# Patient Record
Sex: Female | Born: 1935 | Race: White | Hispanic: No | Marital: Married | Smoking: Never smoker
Health system: Southern US, Community
[De-identification: ages and names within clinical notes are randomized; demographics above are authoritative.]

## PROBLEM LIST (undated history)

## (undated) DIAGNOSIS — N289 Disorder of kidney and ureter, unspecified: Secondary | ICD-10-CM

## (undated) DIAGNOSIS — R7301 Impaired fasting glucose: Secondary | ICD-10-CM

## (undated) DIAGNOSIS — J810 Acute pulmonary edema: Secondary | ICD-10-CM

## (undated) DIAGNOSIS — I639 Cerebral infarction, unspecified: Secondary | ICD-10-CM

## (undated) DIAGNOSIS — H3530 Unspecified macular degeneration: Secondary | ICD-10-CM

## (undated) DIAGNOSIS — M1990 Unspecified osteoarthritis, unspecified site: Secondary | ICD-10-CM

## (undated) DIAGNOSIS — E785 Hyperlipidemia, unspecified: Secondary | ICD-10-CM

## (undated) DIAGNOSIS — I10 Essential (primary) hypertension: Secondary | ICD-10-CM

## (undated) HISTORY — PX: TONSILLECTOMY: SUR1361

## (undated) HISTORY — PX: TUBAL LIGATION: SHX77

## (undated) HISTORY — PX: CATARACT EXTRACTION: SUR2

---

## 1998-08-20 ENCOUNTER — Ambulatory Visit (HOSPITAL_COMMUNITY): Admission: RE | Admit: 1998-08-20 | Discharge: 1998-08-20 | Payer: Self-pay | Admitting: Urology

## 2008-01-16 ENCOUNTER — Ambulatory Visit: Payer: Self-pay | Admitting: Cardiology

## 2008-01-26 ENCOUNTER — Ambulatory Visit: Payer: Self-pay | Admitting: Cardiology

## 2009-08-26 DIAGNOSIS — R079 Chest pain, unspecified: Secondary | ICD-10-CM

## 2009-08-26 DIAGNOSIS — E785 Hyperlipidemia, unspecified: Secondary | ICD-10-CM

## 2009-11-22 ENCOUNTER — Ambulatory Visit: Payer: Self-pay | Admitting: Cardiology

## 2010-10-14 ENCOUNTER — Ambulatory Visit: Payer: Self-pay | Admitting: Internal Medicine

## 2010-10-30 ENCOUNTER — Ambulatory Visit: Payer: Self-pay | Admitting: Internal Medicine

## 2010-10-30 ENCOUNTER — Ambulatory Visit (HOSPITAL_COMMUNITY): Admission: RE | Admit: 2010-10-30 | Discharge: 2010-10-30 | Payer: Self-pay | Admitting: Internal Medicine

## 2011-04-28 NOTE — Assessment & Plan Note (Signed)
[facility]                          [place] [facility] OFFICE NOTE   TIFFANCY, MOGER                      MRN:          [id number]  DATE:01/16/2008                            DOB:          [birth year]    PRIMARY CARDIOLOGIST:  Luis Abed, MD, [facility] (new)   REFERRING PHYSICIAN:  Dr. Fara Chute.   REASON FOR CONSULTATION:  Brooke Gibbs is a 77 year old female, with  history of atypical chest pain but no documented history of ischemic  heart disease, now referred for evaluation of chest pain.   During a recent routine clinic follow-up, Brooke Gibbs reported  experiencing some recent left-sided chest discomfort.  However, this  occurred in early December and she is extremely vague on the surrounding  circumstances for this episode.  In fact, she is unable to recall what  precipitated the discomfort, but feels that it could not have lasted  very long.  She is unable to recall what she was doing at that time.  She seems to suggest that it was essentially localized to the left  shoulder, and that it did not travel all the way down to the fingertips.  It is also not clear if this extended into the left side of the  precordium.  What is clear, however, is that  she denies any exertion-  induced chest discomfort either remotely, or in the recent past.  In  fact, the patient states that she typically walks several times a day  and has never experienced any associated chest discomfort, even when  walking uphill.   The patient's cardiac risk factors notable for hyperlipidemia, tobacco  smoking, age, and family history.   Ms. Sherman has not had prior workup with either a 2-D echocardiogram or  a stress test.  Carotid Doppler study in November, 2007 did not suggest  any significant atherosclerosis.   Electrocardiogram today reveals NSR at 61 bpm with normal axis and  nonspecific ST abnormalities.  There is also poor R-wave progression in  the  precordial leads.   ALLERGIES:  STEROIDS.   CURRENT MEDICATIONS:  1. Aspirin 81 daily.  2. Glucosamine.  3. Fish oil 1000 daily.  4. Red Yeast Rice.  5. Pantoprazole 4o daily.  6. Lorazepam 1.5 mg daily.   PAST MEDICAL HISTORY:  Hyperlipidemia.   SURGICAL HISTORY:  Remote foot surgery.   SOCIAL HISTORY:  The patient smokes an occasional cigar.  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   REVIEW OF SYSTEMS:  Denies history of hypertension or diabetes mellitus.  Otherwise, as noted per HPI, remaining systems negative.   PHYSICAL EXAMINATION:  Blood pressure 134/84, pulse 66, regular; weight  178.8.  GENERAL:  77 year old female sitting upright in no distress.  HEENT: Normocephalic, atraumatic.  NECK: Palpable bilateral carotid pulses without bruits. No JVD at 90  degrees.  LUNGS:  Clear to auscultation in all fields.  HEART: Regular rate and rhythm (S1, S2). No significant murmurs. No  rubs.  ABDOMEN: protuberant, nontender with intact bowel sounds.  EXTREMITIES: Palpable distal pulses without significant edema.  NEURO: No focal deficit.  FAMILY HISTORY:  Sister deceased in her 33s, status post bypass surgery  at age 68.  Brother died in his 46s, complications of stroke.   LABORATORY DATA:  Lipid profile in 2003 notable for total cholesterol  221, triglyceride 115, HDL 56 and LDL of 142.   IMPRESSION:  1. Atypical chest pain.  2. Multiple cardiac risk factors.      a.     Hyperlipidemia.      b.     Tobacco.      c.     Family history.      d.     Age.   PLAN:  1. Schedule adenosine stress Cardiolite for risk stratification.  2. Schedule a 2-D echocardiogram to assess left ventricular function      and rule out underlying structural abnormalities.  3. Schedule return clinic follow-up with myself and Dr. Myrtis Ser in 1      month, for review of study      results and further recommendations.  In the interim, the patient      is to continue current medication  regimen.      Rozell Searing, PA-C  Electronically Signed      Luis Abed, MD, [facility]  Electronically Signed   GS/MedQ  DD: 01/16/2008  DT: 01/16/2008  Job #: [id number]   cc:   Selinda Flavin

## 2011-05-01 NOTE — H&P (Signed)
NAME:  Brooke Gibbs, Brooke Gibbs               ACCOUNT NO.:  [id number]   MEDICAL RECORD NO.:  [id number]        PATIENT TYPE:  PAMB   LOCATION:  DAY                           FACILITY:  [facility]   PHYSICIAN:  Lionel December, M.D.         DATE OF BIRTH:   DATE OF ADMISSION:  DATE OF DISCHARGE:  LH                              HISTORY & PHYSICAL    HISTORY OF PRESENT ILLNESS:  Brooke Gibbs is a referral from Dr. Leandrew Koyanagi  for change in bowel habits.  She complains of fullness in Brooke Gibbs abdomen.  She states she takes a laxative about 3 times a week to have a bowel  movement.  She has also taken a stool softener at night for Brooke Gibbs  constipation which has been occurring for about 3 months.  She states  she used to exercise, but stopped after Brooke Gibbs daughter died 4 years ago  from kidney disease.  Brooke Gibbs appetite has remained good.  She has had no  unintentional weight loss.  Brooke Gibbs BMs are usually dark brown, hard, normal  size, occasionally they are small.  She usually has a bowel movement  about every other day.  She does not complain of abdominal pain today  just an uncomfortable feeling.  She also complains of abdominal  distention which has been occurring off and on for about 3 months.   REVIEW OF SYSTEMS:  She denies fever, fatigue.  No weight loss, no  appetite changes, no dysphagia, no nausea, vomiting, no hematemesis, no  melena or bright red rectal bleeding.  Of note, she has never had a  colonoscopy.   ALLERGIES:  She is allergic to SULFA which causes itching and she is  allergic to STEROIDS which causes hyperactivity.   MEDICATIONS:  1. She is on MVI one a day.  2. Fish oil one a day.  3. Glucosamine one a day.  4. Calcium one a day.  5. Gluten one a day.  6. Simvastatin 40 mg one a day.  7. Omeprazole 20 mg one a day.  8. Ativan 1 mg at bedtime.  9. Stool softener daily.  10.Aspirin 81 mg a day.   She has never had any surgeries.   Medical history includes high cholesterol, macular  degeneration, acid  reflux, interstitial cystitis, arthritis.   FAMILY HISTORY:  Brooke Gibbs mother is deceased from pneumonia.  Brooke Gibbs father  committed suicide.  She has 2 sisters, one is deceased from unknown  causes and lived to be in Brooke Gibbs 86s, one sister is alive with a history of  leukemia and she has breathing problems.  One brother deceased from a  CVA.  She is married.  She is retired from [employer].  She does  not smoke, drink, or do drugs.  She had 9 children, one is deceased from  a kidney disease, 2 children have had kidney transplants, and 6 are in  good health at this time.   OBJECTIVE:  VITAL SIGNS:  Brooke Gibbs weight is 189.3, height 5 feet 3 inches,  blood pressure is 132/80, Brooke Gibbs pulse is 80.  MOUTH:  She wears  dentures upper and lower.  Brooke Gibbs oral mucosa is moist.  There are no lesions.  EYES:  Brooke Gibbs conjunctivae are pink.  Brooke Gibbs sclerae are anicteric.  NECK:  Brooke Gibbs thyroid is normal.  There is no cervical lymphadenopathy.  LUNGS:  Clear.  HEART:  Slightly irregular.  No murmurs heard.  EXTREMITIES:  There is no edema to Brooke Gibbs extremities.  ABDOMEN:  Soft.  Bowel sounds are positive.  No masses.  She does have  prominent veins to Brooke Gibbs left side of Brooke Gibbs abdomen.  NEUROLOGIC:  She is alert and oriented.   LABORATORY DATA:  Hemoglobin is 13.5, hematocrit is 40.0.  WBC is 4.9,  RBC is 4.47, MCV is 90, MCH 30.2, Brooke Gibbs platelets are 273, glucose is 101,  BUN is 11, creatinine is 0.86.  Brooke Gibbs TSH is 3.94.   ASSESSMENT:  Ms. Gibbs is a 77 year old female with a new onset of  constipation for 3 months and a feeling of bloating and abdominal  distention.  Colonic neoplasm needs to be ruled out at this point.  She  has never had a colonoscopy.   RECOMMENDATIONS:  We will schedule a diagnostic colonoscopy with Dr.  Karilyn Cota.  Colonoscopy risk and benefits were reviewed with the patient.  She is to start MiraLax one half scoop to one scoop daily for Brooke Gibbs  constipation.  She is to continue the  omeprazole 20 mg once a day.     ______________________________  Dorene Ar, NP    ______________________________  Lionel December, M.D.     TS/MEDQ  D:  10/14/2010  T:  10/14/2010  Job:  [id number]

## 2012-08-05 DIAGNOSIS — R072 Precordial pain: Secondary | ICD-10-CM

## 2012-08-22 ENCOUNTER — Inpatient Hospital Stay (HOSPITAL_COMMUNITY)
Admission: EM | Admit: 2012-08-22 | Discharge: 2012-08-26 | DRG: 482 | Disposition: A | Discharge status: Skilled Nursing Facility | Payer: Medicare HMO | Attending: Internal Medicine | Admitting: Internal Medicine

## 2012-08-22 ENCOUNTER — Encounter (HOSPITAL_COMMUNITY): Payer: Self-pay | Admitting: *Deleted

## 2012-08-22 ENCOUNTER — Emergency Department (HOSPITAL_COMMUNITY): Payer: Medicare HMO

## 2012-08-22 DIAGNOSIS — K5900 Constipation, unspecified: Secondary | ICD-10-CM | POA: Diagnosis present

## 2012-08-22 DIAGNOSIS — Z79899 Other long term (current) drug therapy: Secondary | ICD-10-CM

## 2012-08-22 DIAGNOSIS — W19XXXA Unspecified fall, initial encounter: Secondary | ICD-10-CM | POA: Diagnosis present

## 2012-08-22 DIAGNOSIS — R030 Elevated blood-pressure reading, without diagnosis of hypertension: Secondary | ICD-10-CM | POA: Diagnosis present

## 2012-08-22 DIAGNOSIS — S72001A Fracture of unspecified part of neck of right femur, initial encounter for closed fracture: Secondary | ICD-10-CM | POA: Diagnosis present

## 2012-08-22 DIAGNOSIS — R7309 Other abnormal glucose: Secondary | ICD-10-CM | POA: Diagnosis present

## 2012-08-22 DIAGNOSIS — H3530 Unspecified macular degeneration: Secondary | ICD-10-CM | POA: Diagnosis present

## 2012-08-22 DIAGNOSIS — Z6836 Body mass index (BMI) 36.0-36.9, adult: Secondary | ICD-10-CM

## 2012-08-22 DIAGNOSIS — E669 Obesity, unspecified: Secondary | ICD-10-CM | POA: Diagnosis present

## 2012-08-22 DIAGNOSIS — D72829 Elevated white blood cell count, unspecified: Secondary | ICD-10-CM | POA: Diagnosis present

## 2012-08-22 DIAGNOSIS — Y92009 Unspecified place in unspecified non-institutional (private) residence as the place of occurrence of the external cause: Secondary | ICD-10-CM

## 2012-08-22 DIAGNOSIS — Z7982 Long term (current) use of aspirin: Secondary | ICD-10-CM

## 2012-08-22 DIAGNOSIS — Z882 Allergy status to sulfonamides status: Secondary | ICD-10-CM

## 2012-08-22 DIAGNOSIS — S72009A Fracture of unspecified part of neck of unspecified femur, initial encounter for closed fracture: Principal | ICD-10-CM | POA: Diagnosis present

## 2012-08-22 DIAGNOSIS — E782 Mixed hyperlipidemia: Secondary | ICD-10-CM | POA: Diagnosis present

## 2012-08-22 DIAGNOSIS — M129 Arthropathy, unspecified: Secondary | ICD-10-CM | POA: Diagnosis present

## 2012-08-22 DIAGNOSIS — R739 Hyperglycemia, unspecified: Secondary | ICD-10-CM | POA: Diagnosis present

## 2012-08-22 DIAGNOSIS — E785 Hyperlipidemia, unspecified: Secondary | ICD-10-CM

## 2012-08-22 DIAGNOSIS — R079 Chest pain, unspecified: Secondary | ICD-10-CM

## 2012-08-22 HISTORY — DX: Hyperlipidemia, unspecified: E78.5

## 2012-08-22 HISTORY — DX: Unspecified macular degeneration: H35.30

## 2012-08-22 HISTORY — DX: Unspecified osteoarthritis, unspecified site: M19.90

## 2012-08-22 LAB — URINALYSIS, ROUTINE W REFLEX MICROSCOPIC
Bilirubin Urine: NEGATIVE
Glucose, UA: NEGATIVE mg/dL
Ketones, ur: NEGATIVE mg/dL
Protein, ur: NEGATIVE mg/dL
Urobilinogen, UA: 0.2 mg/dL (ref 0.0–1.0)

## 2012-08-22 LAB — CBC WITH DIFFERENTIAL/PLATELET
HCT: 40.2 % (ref 36.0–46.0)
Hemoglobin: 13.5 g/dL (ref 12.0–15.0)
Lymphocytes Relative: 8 % — ABNORMAL LOW (ref 12–46)
Lymphs Abs: 1.4 10*3/uL (ref 0.7–4.0)
Monocytes Absolute: 1.5 10*3/uL — ABNORMAL HIGH (ref 0.1–1.0)
Monocytes Relative: 9 % (ref 3–12)
Neutro Abs: 14 10*3/uL — ABNORMAL HIGH (ref 1.7–7.7)
WBC: 16.9 10*3/uL — ABNORMAL HIGH (ref 4.0–10.5)

## 2012-08-22 LAB — PREPARE RBC (CROSSMATCH)

## 2012-08-22 LAB — COMPREHENSIVE METABOLIC PANEL
AST: 17 U/L (ref 0–37)
BUN: 16 mg/dL (ref 6–23)
CO2: 29 mEq/L (ref 19–32)
Chloride: 100 mEq/L (ref 96–112)
Creatinine, Ser: 0.79 mg/dL (ref 0.50–1.10)
GFR calc non Af Amer: 77 mL/min — ABNORMAL LOW (ref >90)
Total Bilirubin: 0.7 mg/dL (ref 0.3–1.2)

## 2012-08-22 LAB — URINE MICROSCOPIC-ADD ON

## 2012-08-22 MED ORDER — ONDANSETRON HCL 4 MG/2ML IJ SOLN: 4.0000 mg | Freq: Four times a day (QID) | INTRAMUSCULAR | Status: DC | PRN

## 2012-08-22 MED ORDER — DIPHENHYDRAMINE HCL 50 MG/ML IJ SOLN: 12.5000 mg | Freq: Four times a day (QID) | INTRAMUSCULAR | Status: DC | PRN

## 2012-08-22 MED ORDER — DOCUSATE SODIUM 100 MG PO CAPS
100.0000 mg | ORAL_CAPSULE | Freq: Two times a day (BID) | ORAL | Status: DC
  Administered 2012-08-22 – 2012-08-26 (×8): 100 mg via ORAL
  Filled 2012-08-22 (×8): qty 1

## 2012-08-22 MED ORDER — VITAMIN B-12 100 MCG PO TABS
100.0000 ug | ORAL_TABLET | Freq: Every day | ORAL | Status: DC
  Administered 2012-08-22 – 2012-08-26 (×5): 100 ug via ORAL
  Filled 2012-08-22 (×8): qty 1

## 2012-08-22 MED ORDER — CEFAZOLIN SODIUM 1-5 GM-% IV SOLN: 1.0000 g | INTRAVENOUS | Status: DC

## 2012-08-22 MED ORDER — POLYETHYLENE GLYCOL 3350 17 G PO PACK
17.0000 g | PACK | Freq: Every day | ORAL | Status: DC | PRN
  Administered 2012-08-26: 17 g via ORAL
  Filled 2012-08-22: qty 1

## 2012-08-22 MED ORDER — ACETAMINOPHEN 325 MG PO TABS: 650.0000 mg | ORAL_TABLET | Freq: Four times a day (QID) | ORAL | Status: DC | PRN

## 2012-08-22 MED ORDER — DEXTROSE-NACL 5-0.45 % IV SOLN
INTRAVENOUS | Status: DC
  Administered 2012-08-22 – 2012-08-23 (×2): via INTRAVENOUS

## 2012-08-22 MED ORDER — NALOXONE HCL 0.4 MG/ML IJ SOLN: 0.4000 mg | INTRAMUSCULAR | Status: DC | PRN

## 2012-08-22 MED ORDER — OCUVITE-LUTEIN PO CAPS
1.0000 | ORAL_CAPSULE | Freq: Every day | ORAL | Status: DC
  Administered 2012-08-22 – 2012-08-26 (×5): 1 via ORAL
  Filled 2012-08-22 (×5): qty 1

## 2012-08-22 MED ORDER — ONDANSETRON HCL 4 MG PO TABS
4.0000 mg | ORAL_TABLET | Freq: Four times a day (QID) | ORAL | Status: DC | PRN
  Administered 2012-08-23: 4 mg via ORAL
  Filled 2012-08-22: qty 1

## 2012-08-22 MED ORDER — ALUM & MAG HYDROXIDE-SIMETH 200-200-20 MG/5ML PO SUSP: 30.0000 mL | Freq: Four times a day (QID) | ORAL | Status: DC | PRN

## 2012-08-22 MED ORDER — ADULT MULTIVITAMIN W/MINERALS CH
1.0000 | ORAL_TABLET | Freq: Every day | ORAL | Status: DC
  Administered 2012-08-22 – 2012-08-24 (×3): 1 via ORAL
  Filled 2012-08-22 (×4): qty 1

## 2012-08-22 MED ORDER — SIMVASTATIN 20 MG PO TABS
20.0000 mg | ORAL_TABLET | Freq: Every day | ORAL | Status: DC
  Administered 2012-08-22 – 2012-08-26 (×5): 20 mg via ORAL
  Filled 2012-08-22 (×5): qty 1

## 2012-08-22 MED ORDER — MORPHINE SULFATE (PF) 1 MG/ML IV SOLN
INTRAVENOUS | Status: DC
  Administered 2012-08-22: 17:00:00 via INTRAVENOUS
  Administered 2012-08-22: 12 mg via INTRAVENOUS
  Administered 2012-08-23: 3 mg via INTRAVENOUS
  Administered 2012-08-23: 1.8 mg via INTRAVENOUS
  Administered 2012-08-23: 6 mg via INTRAVENOUS
  Administered 2012-08-23: 09:00:00 via INTRAVENOUS
  Administered 2012-08-23: 1.5 mg via INTRAVENOUS
  Filled 2012-08-22 (×2): qty 25

## 2012-08-22 MED ORDER — SODIUM CHLORIDE 0.9 % IJ SOLN: 9.0000 mL | INTRAMUSCULAR | Status: DC | PRN

## 2012-08-22 MED ORDER — BIOTENE DRY MOUTH MT LIQD
15.0000 mL | Freq: Two times a day (BID) | OROMUCOSAL | Status: DC
  Administered 2012-08-22 – 2012-08-26 (×8): 15 mL via OROMUCOSAL

## 2012-08-22 MED ORDER — POVIDONE-IODINE 10 % EX SOLN
Freq: Once | CUTANEOUS | Status: AC
  Administered 2012-08-22: 22:00:00 via TOPICAL
  Filled 2012-08-22: qty 118

## 2012-08-22 MED ORDER — OMEGA-3-ACID ETHYL ESTERS 1 G PO CAPS
1.0000 g | ORAL_CAPSULE | Freq: Every day | ORAL | Status: DC
  Administered 2012-08-22 – 2012-08-26 (×5): 1 g via ORAL
  Filled 2012-08-22 (×5): qty 1

## 2012-08-22 MED ORDER — PANTOPRAZOLE SODIUM 40 MG PO TBEC
40.0000 mg | DELAYED_RELEASE_TABLET | Freq: Every day | ORAL | Status: DC
  Administered 2012-08-24 – 2012-08-26 (×3): 40 mg via ORAL
  Filled 2012-08-22 (×3): qty 1

## 2012-08-22 MED ORDER — CALCIUM CARBONATE-VITAMIN D 500-200 MG-UNIT PO TABS
1.0000 | ORAL_TABLET | Freq: Every day | ORAL | Status: DC
  Administered 2012-08-22 – 2012-08-26 (×5): 1 via ORAL
  Filled 2012-08-22 (×5): qty 1

## 2012-08-22 MED ORDER — ACETAMINOPHEN 650 MG RE SUPP: 650.0000 mg | Freq: Four times a day (QID) | RECTAL | Status: DC | PRN

## 2012-08-22 MED ORDER — LORAZEPAM 1 MG PO TABS
1.0000 mg | ORAL_TABLET | Freq: Every day | ORAL | Status: DC
  Administered 2012-08-22 – 2012-08-25 (×4): 1 mg via ORAL
  Filled 2012-08-22 (×4): qty 1

## 2012-08-22 MED ORDER — DIPHENHYDRAMINE HCL 12.5 MG/5ML PO ELIX: 12.5000 mg | ORAL_SOLUTION | Freq: Four times a day (QID) | ORAL | Status: DC | PRN

## 2012-08-22 NOTE — ED Provider Notes (Addendum)
History     CSN: [id number]  Arrival date & time 08/22/12  1157   First MD Initiated Contact with Patient 08/22/12 1234      Chief Complaint  Patient presents with  . Hip Pain    (Consider location/radiation/quality/duration/timing/severity/associated sxs/prior treatment) HPI...Marland Kitchenaccidental fall at home today resulting in right hip pain. No head or neck trauma. Movement makes symptoms worse. Severity is moderate to severe. No radiation of pain. No other injuries.  Past Medical History  Diagnosis Date  . Arthritis     History reviewed. No pertinent past surgical history.  History reviewed. No pertinent family history.  History  Substance Use Topics  . Smoking status: Never Smoker   . Smokeless tobacco: Not on file  . Alcohol Use: No    OB History    Grav Para Term Preterm Abortions TAB SAB Ect Mult Living                  Review of Systems  All other systems reviewed and are negative.    Allergies  Sulfa drugs cross reactors  Home Medications   Current Outpatient Rx  Name Route Sig Dispense Refill  . ASPIRIN EC 81 MG PO TBEC Oral Take 81 mg by mouth daily.    Marland Kitchen CALCIUM + D PO Oral Take 1 tablet by mouth daily.    Marland Kitchen VITAMIN B-12 PO Oral Take 1 tablet by mouth daily.    . OMEGA-3 FATTY ACIDS 1000 MG PO CAPS Oral Take 1 g by mouth daily.    Marland Kitchen LORAZEPAM 1 MG PO TABS Oral Take 1 mg by mouth at bedtime.    Carma Leaven M PLUS PO TABS Oral Take 1 tablet by mouth daily.    . OCUVITE-LUTEIN PO CAPS Oral Take 1 capsule by mouth daily.    Marland Kitchen OMEPRAZOLE 20 MG PO CPDR Oral Take 20 mg by mouth daily.    Marland Kitchen SIMVASTATIN 20 MG PO TABS Oral Take 20 mg by mouth every evening.      BP 156/73  Pulse 71  Temp 98 F (36.7 C) (Oral)  Resp 20  Ht 5\' 2"  (1.575 m)  Wt 184 lb (83.462 kg)  BMI 33.65 kg/m2  SpO2 98%  Physical Exam  Nursing note and vitals reviewed. Constitutional: She is oriented to person, place, and time. She appears well-developed and well-nourished.  HENT:    Head: Normocephalic and atraumatic.  Eyes: Conjunctivae and EOM are normal. Pupils are equal, round, and reactive to light.  Neck: Normal range of motion. Neck supple.  Cardiovascular: Normal rate, regular rhythm and normal heart sounds.   Pulmonary/Chest: Effort normal and breath sounds normal.  Abdominal: Soft. Bowel sounds are normal.  Musculoskeletal:       Tender right lateral hip  Neurological: She is alert and oriented to person, place, and time.  Skin: Skin is warm and dry.  Psychiatric: She has a normal mood and affect.    ED Course  Procedures (including critical care time)  Labs Reviewed - No data to display Dg Chest 1 View  08/22/2012  *RADIOLOGY REPORT*  Clinical Data: Fall with right hip pain.  CHEST - 1 VIEW  Comparison: CT chest 01/23/2010.  Findings: Trachea is midline.  Heart size is accentuated by technique.  Lungs are clear.  No pleural fluid.  IMPRESSION: No acute findings.   Original Report Authenticated By: Reyes Ivan, M.D.    Dg Hip Complete Right  08/22/2012  *RADIOLOGY REPORT*  Clinical Data: Fall with  lateral right hip pain.  RIGHT HIP - COMPLETE 2+ VIEW  Comparison: None.  Findings: There is a fracture at the base of the right femoral neck with override of fracture fragments and varus angulation.  No dislocation.  Degenerative changes are seen in the spine and symphysis pubis.  Left hip is unremarkable.  IMPRESSION: Right femoral neck fracture.   Original Report Authenticated By: Reyes Ivan, M.D.      1. Fracture of femoral neck, right       MDM  Status post accidental fall with right femoral neck fracture. Discussed with orthopedic surgeon and hospitalist. Admit to general medicine. No head or neck trauma.         Donnetta Hutching, MD 08/22/12 1610  Donnetta Hutching, MD 08/28/12 [id number]

## 2012-08-22 NOTE — ED Notes (Signed)
Patient fell this morning, landing on right hip.  Struggled to [employer].  Pain became severe at which time EMS called.

## 2012-08-22 NOTE — ED Notes (Signed)
Report called to [place], Theola Sequin on unit 200.

## 2012-08-22 NOTE — H&P (Signed)
[facility] History and Physical  Brooke Gibbs ZOX:[id number] DOB: [birth year] DOA: 08/22/2012  Referring physician: Dr. Donnetta Hutching PCP: Juliette Alcide, MD   Chief Complaint: Right hip pain.   History of Present Illness: Brooke Gibbs is an 78 y.o. female with no significant PMH who had a mechanical fall this morning at home and had right hip pain post fall.  She was reaching up to a cabinet and got a bit dizzy prior to the fall, but denies any loss of consciousness.  She was unable to stand or bear weight after the fall and was subsequently brought to the hospital where radiographs confirm a low, deplaced right femoral neck fracture.  She has been seen by the orthopedic surgeon who plans to take her to surgery tomorrow.  Review of Systems: Constitutional: No fever, no chills;  Appetite normal; No weight loss, + weight gain.  HEENT: No blurry vision, no diplopia, no pharyngitis, no dysphagia CV: No chest pain, no palpitations.  Resp: + SOB with exertion, no cough. GI: No nausea, no vomiting, no diarrhea, no melena, no hematochezia.  GU: No dysuria, no hematuria.  MSK: no myalgias, no arthralgias, + right hip pain with movement.  Neuro:  No headache, no focal neurological deficits, no history of seizures.  Psych: No depression, no anxiety.  Endo: No thyroid disease, no DM, no heat intolerance, no cold intolerance, no polyuria, no polydipsia  Skin: No rashes, no skin lesions.  Heme: No easy bruising, no history of blood diseases.  Past Medical History Past Medical History  Diagnosis Date  . Arthritis   . Hyperlipidemia   . Macular degeneration      Past Surgical History Past Surgical History  Procedure Date  . Tubal ligation   . Bilateral cateract surgery with lens implants      Social History: History   Social History  . Marital Status: Married    Spouse Name: N/A    Number of Children: 9  . Years of Education: N/A   Occupational History  . Retired Copy     Social History Main Topics  . Smoking status: Never Smoker   . Smokeless tobacco: Not on file  . Alcohol Use: No  . Drug Use: No  . Sexually Active: No   Other Topics Concern  . Not on file   Social History Narrative   Married.  Lives with husband.  Ambulates independently.  9 children, 2 deceased.    Family History:  Family History  Problem Relation Age of Onset  . Heart failure Mother   . Cancer Sister   . Heart failure Sister   . Stroke Brother   . Kidney disease Daughter     Allergies: Sulfa drugs cross reactors  Meds: Prior to Admission medications   Medication Sig Start Date End Date Taking? Authorizing Provider  aspirin EC 81 MG tablet Take 81 mg by mouth daily.   Yes Historical Provider, MD  Calcium Carbonate-Vitamin D (CALCIUM + D PO) Take 1 tablet by mouth daily.   Yes Historical Provider, MD  Cyanocobalamin (VITAMIN B-12 PO) Take 1 tablet by mouth daily.   Yes Historical Provider, MD  fish oil-omega-3 fatty acids 1000 MG capsule Take 1 g by mouth daily.   Yes Historical Provider, MD  LORazepam (ATIVAN) 1 MG tablet Take 1 mg by mouth at bedtime.   Yes Historical Provider, MD  Multiple Vitamins-Minerals (MULTIVITAMINS THER. W/MINERALS) TABS Take 1 tablet by mouth daily.   Yes Historical Provider, MD  multivitamin-lutein (OCUVITE-LUTEIN) CAPS Take 1 capsule by mouth daily.   Yes Historical Provider, MD  omeprazole (PRILOSEC) 20 MG capsule Take 20 mg by mouth daily.   Yes Historical Provider, MD  simvastatin (ZOCOR) 20 MG tablet Take 20 mg by mouth every evening.   Yes Historical Provider, MD    Physical Exam: Filed Vitals:   08/22/12 1108 08/22/12 1316  BP: 162/80 156/73  Pulse: 62 71  Temp: 98 F (36.7 C)   TempSrc: Oral   Resp:  20  Height: 5\' 2"  (1.575 m)   Weight: 83.462 kg (184 lb)   SpO2: 99% 98%     Physical Exam: Blood pressure 156/73, pulse 71, temperature 98 F (36.7 C), temperature source Oral, resp. rate 20, height 5\' 2"  (1.575 m),  weight 83.462 kg (184 lb), SpO2 98.00%. BP 156/73  Pulse 71  Temp 98 F (36.7 C) (Oral)  Resp 20  Ht 5\' 2"  (1.575 m)  Wt 83.462 kg (184 lb)  BMI 33.65 kg/m2  SpO2 98%  General Appearance:    Alert, cooperative, no distress, appears stated age  Head:    Normocephalic, without obvious abnormality, atraumatic  Eyes:    PERRL, conjunctiva/corneas clear, EOM's intact  Ears:    Normal external ear canals, both ears  Nose:   Nares normal, septum midline, mucosa normal, no drainage    or sinus tenderness  Throat:   Lips, mucosa, and tongue normal; teeth and gums normal  Neck:   Supple, symmetrical, trachea midline, no adenopathy;    thyroid:  no enlargement/tenderness/nodules; no carotid   bruit or JVD  Lungs:     Clear to auscultation bilaterally, respirations unlabored  Chest Wall:    No tenderness or deformity   Heart:    Regular rate and rhythm, S1 and S2 normal, no murmur, rub   or gallop  Abdomen:     Soft, non-tender, bowel sounds active all four quadrants,    no masses, no organomegaly  Extremities:   Extremities normal, atraumatic, no cyanosis or edema  Pulses:   2+ and symmetric all extremities  Skin:   Skin color, texture, turgor normal, no rashes or lesions  Lymph nodes:   Cervical, supraclavicular, and axillary nodes normal  Neurologic:   CNII-XII intact, normal strength, sensation and reflexes    throughout    Labs on Admission:  Basic Metabolic Panel:  Lab 08/22/12 1610  NA 137  K 3.8  CL 100  CO2 29  GLUCOSE 128*  BUN 16  CREATININE 0.79  CALCIUM 9.4  MG --  PHOS --   Liver Function Tests:  Lab 08/22/12 1518  AST 17  ALT 14  ALKPHOS 81  BILITOT 0.7  PROT 7.2  ALBUMIN 3.7   CBC:  Lab 08/22/12 1518  WBC 16.9*  NEUTROABS 14.0*  HGB 13.5  HCT 40.2  MCV 90.7  PLT 306   Radiological Exams on Admission: Dg Chest 1 View  08/22/2012  *RADIOLOGY REPORT*  Clinical Data: Fall with right hip pain.  CHEST - 1 VIEW  Comparison: CT chest 01/23/2010.   Findings: Trachea is midline.  Heart size is accentuated by technique.  Lungs are clear.  No pleural fluid.  IMPRESSION: No acute findings.   Original Report Authenticated By: Reyes Ivan, M.D.    Dg Hip Complete Right  08/22/2012  *RADIOLOGY REPORT*  Clinical Data: Fall with lateral right hip pain.  RIGHT HIP - COMPLETE 2+ VIEW  Comparison: None.  Findings: There is a fracture at  the base of the right femoral neck with override of fracture fragments and varus angulation.  No dislocation.  Degenerative changes are seen in the spine and symphysis pubis.  Left hip is unremarkable.  IMPRESSION: Right femoral neck fracture.   Original Report Authenticated By: Reyes Ivan, M.D.     EKG: Ordered.  Not done yet.  Assessment/Plan Principal Problem:  *Fracture of femoral neck, right  Ortho plans to repair tomorrow, cleared for surgery with known risks.  No history of cardiac disease, lung disease, stroke, DM, renal failure.  No pre-operative cardiac evaluation needed.  PT/OT post surgery, will likely need ST SNF for rehab. Active Problems:  HYPERLIPIDEMIA-MIXED  Continue statin/fish oil.  Leukocytosis  Likely demargination from stress reaction.  Check U/A; CXR clear.  HTN (hypertension)  Not on medication prior to admission.  High BP may be from pain.  Monitor closely.  Obesity (BMI 30.0-34.9)  May benefit from dietician consultation post-operatively for weight loss instruction.  Hyperglycemia  Not a fasting sample.  Check a.m. Fasting glucose.   Code Status: Full Family Communication: Daughter-in-law at bedside.  Peyton Najjar (husband) is emergency contact at [phone]. Disposition Plan: Likely will need ST SNF for rehab.  Time spent: 1 hour.  RAMA,CHRISTINA [facility] Pager [phone]  If 7PM-7AM, please contact night-coverage [email or web address] Password [email or web address] 08/22/2012, 4:04 PM

## 2012-08-22 NOTE — H&P (Signed)
Brooke Gibbs is an 78 y.o. female.   Chief Complaint: Broken right hip HPI: She fell at home today around 8:30 am and hurt her right hip.  She could not stand or move very much.  X-rays here show a low femoral neck fracture displaced of the right hip.  She has no other injury.  Her family doctor is in [place].  Her medical history is essentially negative.  She lives with her husband in [place].  Past Medical History  Diagnosis Date  . Arthritis     History reviewed. No pertinent past surgical history.  History reviewed. No pertinent family history. Social History:  reports that she has never smoked. She does not have any smokeless tobacco history on file. She reports that she does not drink alcohol or use illicit drugs.  Allergies:  Allergies  Allergen Reactions  . Sulfa Drugs Cross Reactors     Rash     (Not in a hospital admission)  No results found for this or any previous visit (from the past 48 hour(s)). Dg Chest 1 View  08/22/2012  *RADIOLOGY REPORT*  Clinical Data: Fall with right hip pain.  CHEST - 1 VIEW  Comparison: CT chest 01/23/2010.  Findings: Trachea is midline.  Heart size is accentuated by technique.  Lungs are clear.  No pleural fluid.  IMPRESSION: No acute findings.   Original Report Authenticated By: Reyes Ivan, M.D.    Dg Hip Complete Right  08/22/2012  *RADIOLOGY REPORT*  Clinical Data: Fall with lateral right hip pain.  RIGHT HIP - COMPLETE 2+ VIEW  Comparison: None.  Findings: There is a fracture at the base of the right femoral neck with override of fracture fragments and varus angulation.  No dislocation.  Degenerative changes are seen in the spine and symphysis pubis.  Left hip is unremarkable.  IMPRESSION: Right femoral neck fracture.   Original Report Authenticated By: Reyes Ivan, M.D.     Review of Systems  Gastrointestinal: Positive for heartburn.  Musculoskeletal: Positive for falls Larey Seat today around 8:30 am at home and hurt right hip.  No  other injury.).  All other systems reviewed and are negative.    Blood pressure 156/73, pulse 71, temperature 98 F (36.7 C), temperature source Oral, resp. rate 20, height 5\' 2"  (1.575 m), weight 83.462 kg (184 lb), SpO2 98.00%. Physical Exam  Constitutional: She is oriented to person, place, and time. She appears well-developed and well-nourished.  HENT:  Head: Normocephalic.  Eyes: Conjunctivae and EOM are normal. Pupils are equal, round, and reactive to light.  Neck: Normal range of motion. Neck supple.  Cardiovascular: Normal rate, regular rhythm and intact distal pulses.   Respiratory: Effort normal and breath sounds normal.  GI: Bowel sounds are normal.  Musculoskeletal: She exhibits tenderness (Pain right hip with external rotation and shortening.).       Legs: Neurological: She is alert and oriented to person, place, and time. She has normal reflexes.  Skin: Skin is warm and dry.  Psychiatric: She has a normal mood and affect. Her behavior is normal. Judgment and thought content normal.     Assessment/Plan Fracture of the right femoral neck low.  I have discussed with her the risks and imponderables of hip surgery on the right hip including infection, possible blood clots which could result in death, need for physical therapy and possible nursing home placement, possible blood transfusion and spinal anesthesia.  She asked appropriate questions.  I plan to do surgery tomorrow if  approved by medicine, hospitalist.  Labs pending.  Jerline Linzy 08/22/2012, 2:59 PM

## 2012-08-22 NOTE — ED Notes (Signed)
R leg turned outward, unable to mobilize w/out severe pain.

## 2012-08-23 ENCOUNTER — Ambulatory Visit (HOSPITAL_COMMUNITY): Admission: RE | Admit: 2012-08-23 | Payer: Medicare HMO | Source: Ambulatory Visit | Admitting: Orthopaedic Surgery

## 2012-08-23 ENCOUNTER — Inpatient Hospital Stay (HOSPITAL_COMMUNITY): Payer: Medicare HMO

## 2012-08-23 ENCOUNTER — Encounter (HOSPITAL_COMMUNITY): Payer: Self-pay | Admitting: Anesthesiology

## 2012-08-23 ENCOUNTER — Encounter (HOSPITAL_COMMUNITY)
Admission: EM | Disposition: A | Discharge status: Skilled Nursing Facility | Payer: Self-pay | Source: Home / Self Care | Attending: Internal Medicine

## 2012-08-23 ENCOUNTER — Inpatient Hospital Stay (HOSPITAL_COMMUNITY): Payer: Medicare HMO | Admitting: Anesthesiology

## 2012-08-23 HISTORY — PX: ORIF HIP FRACTURE: SHX2125

## 2012-08-23 LAB — SURGICAL PCR SCREEN
MRSA, PCR: NEGATIVE
Staphylococcus aureus: NEGATIVE

## 2012-08-23 LAB — TSH: TSH: 1.621 u[IU]/mL (ref 0.350–4.500)

## 2012-08-23 SURGERY — OPEN REDUCTION INTERNAL FIXATION HIP
Anesthesia: Spinal | Site: Hip | Laterality: Right | Wound class: Clean

## 2012-08-23 MED ORDER — PROPOFOL INFUSION 10 MG/ML OPTIME
INTRAVENOUS | Status: DC | PRN
  Administered 2012-08-23: 50 ug/kg/min via INTRAVENOUS
  Administered 2012-08-23: 25 ug/kg/min via INTRAVENOUS

## 2012-08-23 MED ORDER — MIDAZOLAM HCL 2 MG/2ML IJ SOLN
INTRAMUSCULAR | Status: AC
  Filled 2012-08-23: qty 2

## 2012-08-23 MED ORDER — FENTANYL CITRATE 0.05 MG/ML IJ SOLN
INTRAMUSCULAR | Status: AC
  Filled 2012-08-23: qty 2

## 2012-08-23 MED ORDER — SODIUM CHLORIDE 0.9 % IR SOLN
Status: DC | PRN
  Administered 2012-08-23: 1000 mL

## 2012-08-23 MED ORDER — CEFAZOLIN SODIUM 1-5 GM-% IV SOLN
INTRAVENOUS | Status: DC | PRN
  Administered 2012-08-23: 2 g via INTRAVENOUS

## 2012-08-23 MED ORDER — MIDAZOLAM HCL 2 MG/2ML IJ SOLN
1.0000 mg | INTRAMUSCULAR | Status: DC | PRN
Start: 2012-08-23 — End: 2012-08-23
  Administered 2012-08-23: 2 mg via INTRAVENOUS

## 2012-08-23 MED ORDER — FENTANYL CITRATE 0.05 MG/ML IJ SOLN
INTRAMUSCULAR | Status: DC | PRN
  Administered 2012-08-23 (×3): 25 ug via INTRAVENOUS
  Administered 2012-08-23: 25 ug via INTRATHECAL

## 2012-08-23 MED ORDER — FENTANYL CITRATE 0.05 MG/ML IJ SOLN: 25.0000 ug | INTRAMUSCULAR | Status: DC | PRN

## 2012-08-23 MED ORDER — ZOLPIDEM TARTRATE 5 MG PO TABS
5.0000 mg | ORAL_TABLET | Freq: Every day | ORAL | Status: DC
  Filled 2012-08-23: qty 1

## 2012-08-23 MED ORDER — BUPIVACAINE HCL 0.75 % IJ SOLN
INTRAMUSCULAR | Status: DC | PRN
  Administered 2012-08-23: 2 mL via INTRATHECAL

## 2012-08-23 MED ORDER — MIDAZOLAM HCL 5 MG/5ML IJ SOLN
INTRAMUSCULAR | Status: DC | PRN
  Administered 2012-08-23: 2 mg via INTRAVENOUS

## 2012-08-23 MED ORDER — MAGNESIUM HYDROXIDE 400 MG/5ML PO SUSP: 30.0000 mL | Freq: Every day | ORAL | Status: DC | PRN

## 2012-08-23 MED ORDER — LIDOCAINE HCL (PF) 1 % IJ SOLN
INTRAMUSCULAR | Status: AC
  Filled 2012-08-23: qty 5

## 2012-08-23 MED ORDER — ENOXAPARIN SODIUM 40 MG/0.4ML ~~LOC~~ SOLN
40.0000 mg | SUBCUTANEOUS | Status: DC
  Administered 2012-08-24 – 2012-08-26 (×3): 40 mg via SUBCUTANEOUS
  Filled 2012-08-23 (×3): qty 0.4

## 2012-08-23 MED ORDER — BUPIVACAINE IN DEXTROSE 0.75-8.25 % IT SOLN
INTRATHECAL | Status: AC
  Filled 2012-08-23: qty 2

## 2012-08-23 MED ORDER — CEFAZOLIN SODIUM-DEXTROSE 2-3 GM-% IV SOLR: 2.0000 g | INTRAVENOUS | Status: DC

## 2012-08-23 MED ORDER — HYDROGEN PEROXIDE 3 % EX SOLN
CUTANEOUS | Status: DC | PRN
  Administered 2012-08-23: 1

## 2012-08-23 MED ORDER — ACETAMINOPHEN 10 MG/ML IV SOLN
1000.0000 mg | Freq: Four times a day (QID) | INTRAVENOUS | Status: AC
  Administered 2012-08-23 – 2012-08-24 (×3): 1000 mg via INTRAVENOUS
  Filled 2012-08-23 (×4): qty 100

## 2012-08-23 MED ORDER — CEFAZOLIN SODIUM-DEXTROSE 2-3 GM-% IV SOLR
INTRAVENOUS | Status: AC
  Filled 2012-08-23: qty 50

## 2012-08-23 MED ORDER — PROMETHAZINE HCL 25 MG/ML IJ SOLN: 12.5000 mg | INTRAMUSCULAR | Status: DC | PRN

## 2012-08-23 MED ORDER — EPHEDRINE SULFATE 50 MG/ML IJ SOLN
INTRAMUSCULAR | Status: DC | PRN
  Administered 2012-08-23 (×2): 10 mg via INTRAVENOUS

## 2012-08-23 MED ORDER — LIDOCAINE HCL (CARDIAC) 10 MG/ML IV SOLN
INTRAVENOUS | Status: DC | PRN
  Administered 2012-08-23: 50 mg via INTRAVENOUS

## 2012-08-23 MED ORDER — PROPOFOL 10 MG/ML IV EMUL
INTRAVENOUS | Status: AC
  Filled 2012-08-23: qty 20

## 2012-08-23 MED ORDER — ONDANSETRON HCL 4 MG/2ML IJ SOLN: 4.0000 mg | Freq: Once | INTRAMUSCULAR | Status: DC | PRN

## 2012-08-23 MED ORDER — LACTATED RINGERS IV SOLN
INTRAVENOUS | Status: DC | PRN
  Administered 2012-08-23: 1000 mL
  Administered 2012-08-23 (×2): via INTRAVENOUS

## 2012-08-23 MED ORDER — LACTATED RINGERS IV SOLN: INTRAVENOUS | Status: DC

## 2012-08-23 MED ORDER — ALBUTEROL SULFATE (5 MG/ML) 0.5% IN NEBU: 2.5000 mg | INHALATION_SOLUTION | RESPIRATORY_TRACT | Status: DC | PRN

## 2012-08-23 MED ORDER — EPHEDRINE SULFATE 50 MG/ML IJ SOLN
INTRAMUSCULAR | Status: AC
  Filled 2012-08-23: qty 1

## 2012-08-23 MED ORDER — ALBUTEROL SULFATE (5 MG/ML) 0.5% IN NEBU: 2.5000 mg | INHALATION_SOLUTION | Freq: Four times a day (QID) | RESPIRATORY_TRACT | Status: DC

## 2012-08-23 SURGICAL SUPPLY — 51 items
BAG HAMPER (MISCELLANEOUS) ×2 IMPLANT
BIT DRILL TWIST 3.5MM (BIT) ×1 IMPLANT
BLADE SURG SZ10 CARB STEEL (BLADE) ×3 IMPLANT
BLADE SURG SZ20 CARB STEEL (BLADE) ×2 IMPLANT
CLOTH BEACON ORANGE TIMEOUT ST (SAFETY) ×2 IMPLANT
COVER LIGHT HANDLE STERIS (MISCELLANEOUS) ×4 IMPLANT
COVER MAYO STAND XLG (DRAPE) ×2 IMPLANT
DRAPE STERI IOBAN 125X83 (DRAPES) ×2 IMPLANT
DRILL TWIST 3.5MM (BIT) ×2
ELECT REM PT RETURN 9FT ADLT (ELECTROSURGICAL) ×2
ELECTRODE REM PT RTRN 9FT ADLT (ELECTROSURGICAL) ×1 IMPLANT
EVACUATOR 3/16  PVC DRAIN (DRAIN) ×1
EVACUATOR 3/16 PVC DRAIN (DRAIN) ×1 IMPLANT
FLOOR PAD 36X40 (MISCELLANEOUS) ×2
GAUZE XEROFORM 5X9 LF (GAUZE/BANDAGES/DRESSINGS) ×2 IMPLANT
GLOVE BIO SURGEON STRL SZ8 (GLOVE) ×2 IMPLANT
GLOVE BIO SURGEON STRL SZ8.5 (GLOVE) ×2 IMPLANT
GLOVE BIOGEL PI IND STRL 7.0 (GLOVE) ×3 IMPLANT
GLOVE BIOGEL PI INDICATOR 7.0 (GLOVE) ×3
GLOVE EXAM NITRILE MD LF STRL (GLOVE) ×1 IMPLANT
GLOVE SS BIOGEL STRL SZ 6.5 (GLOVE) IMPLANT
GLOVE SUPERSENSE BIOGEL SZ 6.5 (GLOVE) ×1
GOWN STRL REIN XL XLG (GOWN DISPOSABLE) ×7 IMPLANT
GUIDE PIN CALIBRATED (PIN) ×2 IMPLANT
INST SET MAJOR BONE (KITS) ×2 IMPLANT
KIT BLADEGUARD II DBL (SET/KITS/TRAYS/PACK) ×1 IMPLANT
KIT ROOM TURNOVER AP CYSTO (KITS) ×2 IMPLANT
MANIFOLD NEPTUNE II (INSTRUMENTS) ×2 IMPLANT
MARKER SKIN DUAL TIP RULER LAB (MISCELLANEOUS) ×2 IMPLANT
NS IRRIG 1000ML POUR BTL (IV SOLUTION) ×2 IMPLANT
PACK BASIC III (CUSTOM PROCEDURE TRAY) ×2
PACK SRG BSC III STRL LF ECLPS (CUSTOM PROCEDURE TRAY) ×1 IMPLANT
PAD ABD 5X9 TENDERSORB (GAUZE/BANDAGES/DRESSINGS) ×3 IMPLANT
PAD ARMBOARD 7.5X6 YLW CONV (MISCELLANEOUS) ×2 IMPLANT
PAD FLOOR 36X40 (MISCELLANEOUS) IMPLANT
PENCIL HANDSWITCHING (ELECTRODE) ×2 IMPLANT
PLATE SHORT BARRELL 145X4 (Plate) ×2 IMPLANT
SCREW CORTICAL 48MM (Screw) ×2 IMPLANT
SCREW CORTICAL SFTP 4.5X44MM (Screw) ×2 IMPLANT
SCREW CORTICAL SFTP 4.5X46MM (Screw) ×1 IMPLANT
SCREW LAG 105MM (Screw) ×1 IMPLANT
SET BASIN LINEN APH (SET/KITS/TRAYS/PACK) ×2 IMPLANT
SPONGE GAUZE 4X4 12PLY (GAUZE/BANDAGES/DRESSINGS) ×2 IMPLANT
SPONGE LAP 18X18 X RAY DECT (DISPOSABLE) ×4 IMPLANT
STAPLER VISISTAT 35W (STAPLE) ×2 IMPLANT
SUT BRALON NAB BRD #1 30IN (SUTURE) ×4 IMPLANT
SUT PLAIN 2 0 XLH (SUTURE) ×2 IMPLANT
SUT SILK 0 FSL (SUTURE) ×2 IMPLANT
SYR BULB IRRIGATION 50ML (SYRINGE) ×2 IMPLANT
TAPE MEDIFIX FOAM 3 (GAUZE/BANDAGES/DRESSINGS) ×2 IMPLANT
YANKAUER SUCT 12FT TUBE ARGYLE (SUCTIONS) ×1 IMPLANT

## 2012-08-23 NOTE — Clinical Social Work Placement (Signed)
Clinical Social Work Department CLINICAL SOCIAL WORK PLACEMENT NOTE 08/23/2012  Patient:  Brooke Gibbs, Brooke Gibbs  Account Number:  [id number] Admit date:  08/22/2012  Clinical Social Worker:  Derenda Fennel, LCSW  Date/time:  08/23/2012 12:10 PM  Clinical Social Work is seeking post-discharge placement for this patient at the following level of care:   SKILLED NURSING   (*CSW will update this form in Epic as items are completed)   08/23/2012  Patient/family provided with [facility] Department of Clinical Social Work's list of facilities offering this level of care within the geographic area requested by the patient (or if unable, by the patient's family).  08/23/2012  Patient/family informed of their freedom to choose among providers that offer the needed level of care, that participate in Medicare, Medicaid or managed care program needed by the patient, have an available bed and are willing to accept the patient.  08/23/2012  Patient/family informed of [facility]' ownership interest in [facility], as well as of the fact that they are under no obligation to receive care at this facility.  PASARR submitted to EDS on 08/23/2012 PASARR number received from EDS on 08/23/2012  FL2 transmitted to all facilities in geographic area requested by pt/family on  08/23/2012 FL2 transmitted to all facilities within larger geographic area on   Patient informed that his/her managed care company has contracts with or will negotiate with  certain facilities, including the following:     Patient/family informed of bed offers received:   Patient chooses bed at  Physician recommends and patient chooses bed at    Patient to be transferred to  on   Patient to be transferred to facility by   The following physician request were entered in Epic:   Additional Comments:  Derenda Fennel, LCSW [phone]

## 2012-08-23 NOTE — Brief Op Note (Signed)
08/22/2012 - 08/23/2012  12:55 PM  PATIENT:  Brooke Gibbs  78 y.o. female  PRE-OPERATIVE DIAGNOSIS:  right hip fracture  POST-OPERATIVE DIAGNOSIS:  right hip fracture  PROCEDURE:  Procedure(s) (LRB) with comments: OPEN REDUCTION INTERNAL FIXATION HIP (Right) with Katrinka Blazing and Nephew Hip Compression Screw  SURGEON:  Surgeon(s) and Role:    * Darreld Mclean, MD - Primary  PHYSICIAN ASSISTANT:   ASSISTANTS: none   ANESTHESIA:   spinal  EBL:  Total I/O In: 1521.8 [I.V.:1521.8] Out: 500 [Urine:350; Blood:150]  BLOOD ADMINISTERED:none  DRAINS: (Large) Hemovact drain(s) in the right upper thigh area with  Suction Open   LOCAL MEDICATIONS USED:  NONE  SPECIMEN:  No Specimen  DISPOSITION OF SPECIMEN:  N/A  COUNTS:  YES  TOURNIQUET:  * No tourniquets in log *  DICTATION: .Other Dictation: Dictation Number [id number]  PLAN OF CARE: Admit to inpatient   PATIENT DISPOSITION:  PACU - hemodynamically stable.   Delay start of Pharmacological VTE agent (>24hrs) due to surgical blood loss or risk of bleeding: no

## 2012-08-23 NOTE — Clinical Social Work Note (Signed)
CSW received call from pt's daughter Fannie Knee [phone]). Pt's husband and son gave permission earlier for contact with daughter. Fannie Knee lives in [place] but handles many of pt's insurance/medical concerns. CSW updated Fannie Knee and will follow up with bed offers when available.  Derenda Fennel, [place] [phone]

## 2012-08-23 NOTE — Transfer of Care (Signed)
Immediate Anesthesia Transfer of Care Note  Patient: Brooke Gibbs  Procedure(s) Performed: Procedure(s) (LRB) with comments: OPEN REDUCTION INTERNAL FIXATION HIP (Right)  Patient Location: PACU  Anesthesia Type: Spinal  Level of Consciousness: awake, alert , oriented and patient cooperative  Airway & Oxygen Therapy: Patient Spontanous Breathing and Patient connected to nasal cannula oxygen  Post-op Assessment: Report given to PACU RN and Post -op Vital signs reviewed and stable  Post vital signs: Reviewed and stable  Complications: No apparent anesthesia complications

## 2012-08-23 NOTE — Clinical Social Work Psychosocial (Signed)
Clinical Social Work Department BRIEF PSYCHOSOCIAL ASSESSMENT 08/23/2012  Patient:  Brooke Gibbs, Brooke Gibbs     Account Number:  [id number]     Admit date:  08/22/2012  Clinical Social Worker:  Sherrlyn Hock  Date/Time:  08/23/2012 12:15 PM  Referred by:  Physician  Date Referred:  08/23/2012 Referred for  SNF Placement   Other Referral:   Interview type:  Patient Other interview type:   husband, son, and daughter-in-law    PSYCHOSOCIAL DATA Living Status:  FAMILY Admitted from facility:   Level of care:   Primary support name:  Peyton Najjar Primary support relationship to patient:  SPOUSE Degree of support available:   very supportive family. Lives with husband and son and daughter-in-law are nearby.    CURRENT CONCERNS Current Concerns  Post-Acute Placement   Other Concerns:    SOCIAL WORK ASSESSMENT / PLAN CSW met with pt briefly prior to surgery and then spoke with pt's husband, son, and daughter-in-law. Pt alert and oriented and was living with her husband. She was reaching for a stamp yesterday and fell, fracturing her hip. Pt's husband was at home as well and called EMS once she was complaining of hip pain. Pt's family are very involved. They report she was independent in care and had no difficulty with cooking and cleaning. CSW discussed d/c plans and prepared family that SNF likely will be recommended. They are aware of copays and feel that if SNF is needed, several weeks of rehab would be beneficial for pt. SNF list provided. They request [facility] first but are open to [place] as they live in between [place] and [place].   Assessment/plan status:  Psychosocial Support/Ongoing Assessment of Needs Other assessment/ plan:   Information/referral to community resources:   SNF list    PATIENT'S/FAMILY'S RESPONSE TO PLAN OF CARE: Unable to discuss pt's feelings regarding possibility of SNF prior to surgery. Family feel this option would be best prior to returning home. Awaiting PT  consult tomorrow. FL2 faxed out and CSW will follow up tomorrow as well.        Derenda Fennel, [place] [phone]

## 2012-08-23 NOTE — Care Management Note (Unsigned)
    Page 1 of 1   08/23/2012     11:35:39 AM   CARE MANAGEMENT NOTE 08/23/2012  Patient:  Brooke Gibbs, Brooke Gibbs   Account Number:  [id number]  Date Initiated:  08/23/2012  Documentation initiated by:  Rosemary Holms  Subjective/Objective Assessment:   Pt admitted from home with spouse. fell and fx R. Hip. Surgery today.     Action/Plan:   CSW consulted for placement referral. CM will follow.   Anticipated DC Date:  08/26/2012   Anticipated DC Plan:  SKILLED NURSING FACILITY  In-house referral  Clinical Social Worker      DC Planning Services  CM consult      Choice offered to / List presented to:             Status of service:  In process, will continue to follow Medicare Important Message given?   (If response is "NO", the following Medicare IM given date fields will be blank) Date Medicare IM given:   Date Additional Medicare IM given:    Discharge Disposition:    Per UR Regulation:    If discussed at Long Length of Stay Meetings, dates discussed:    Comments:  08/23/12 1100 Tamotsu Wiederh[address] Robosn RN BSN CM

## 2012-08-23 NOTE — Anesthesia Preprocedure Evaluation (Addendum)
Anesthesia Evaluation  Patient identified by MRN, date of birth, ID band Patient awake    Reviewed: Allergy & Precautions, H&P , NPO status , Patient's Chart, lab work & pertinent test results  Airway Mallampati: II      Dental  (+) Edentulous Upper and Edentulous Lower   Pulmonary neg pulmonary ROS,    Pulmonary exam normal       Cardiovascular hypertension, Pt. on medications Rhythm:Regular Rate:Normal     Neuro/Psych    GI/Hepatic   Endo/Other  diabetes (borderline)  Renal/GU      Musculoskeletal   Abdominal   Peds  Hematology   Anesthesia Other Findings   Reproductive/Obstetrics                           Anesthesia Physical Anesthesia Plan  ASA: II  Anesthesia Plan: Spinal   Post-op Pain Management:    Induction:   Airway Management Planned: Nasal Cannula  Additional Equipment:   Intra-op Plan:   Post-operative Plan:   Informed Consent: I have reviewed the patients History and Physical, chart, labs and discussed the procedure including the risks, benefits and alternatives for the proposed anesthesia with the patient or authorized representative who has indicated his/her understanding and acceptance.     Plan Discussed with:   Anesthesia Plan Comments:         Anesthesia Quick Evaluation

## 2012-08-23 NOTE — Preoperative (Signed)
Beta Blockers   Reason not to administer Beta Blockers:Not Applicable 

## 2012-08-23 NOTE — Anesthesia Postprocedure Evaluation (Addendum)
  Anesthesia Post-op Note  Patient: Brooke Gibbs  Procedure(s) Performed: Procedure(s) (LRB) with comments: OPEN REDUCTION INTERNAL FIXATION HIP (Right)  Patient Location: PACU  Anesthesia Type: Spinal  Level of Consciousness: awake, alert , oriented and patient cooperative  Airway and Oxygen Therapy: Patient Spontanous Breathing and Patient connected to nasal cannula oxygen  Post-op Pain: none  Post-op Assessment: Post-op Vital signs reviewed, Patient's Cardiovascular Status Stable, Respiratory Function Stable, RESPIRATORY FUNCTION UNSTABLE and Pain level controlled  Post-op Vital Signs: Reviewed and stable  Complications: No apparent anesthesia complications  08/24/12  Patient doing well.  Denies headache, backache.  Sensation returned to normal in lower extremities.

## 2012-08-23 NOTE — Progress Notes (Signed)
UR Chart Review Completed  

## 2012-08-23 NOTE — Anesthesia Procedure Notes (Signed)
Spinal  Patient location during procedure: OR Start time: 08/23/2012 11:34 AM Staffing CRNA/Resident: ANDRAZA, AMY L Preanesthetic Checklist Completed: patient identified, site marked, surgical consent, pre-op evaluation, timeout performed, IV checked, risks and benefits discussed and monitors and equipment checked Spinal Block Patient position: right lateral decubitus Prep: Betadine Patient monitoring: heart rate, cardiac monitor, continuous pulse ox and blood pressure Approach: right paramedian Location: L3-4 Injection technique: single-shot Needle Needle type: Spinocan  Needle gauge: 22 G Needle length: 9 cm Assessment Sensory level: T8 Additional Notes ATTEMPTS:1 TRAY ZO:10960 TRAY EXPIRATION DATE:06/2013  Marcaine .75% 2ml, Fentanyl 25 mcg epi.1 injected intrathecally at 1134; Patient tolerated well

## 2012-08-24 DIAGNOSIS — R030 Elevated blood-pressure reading, without diagnosis of hypertension: Secondary | ICD-10-CM

## 2012-08-24 DIAGNOSIS — D72829 Elevated white blood cell count, unspecified: Secondary | ICD-10-CM

## 2012-08-24 DIAGNOSIS — S72009A Fracture of unspecified part of neck of unspecified femur, initial encounter for closed fracture: Principal | ICD-10-CM

## 2012-08-24 LAB — CBC WITH DIFFERENTIAL/PLATELET
Eosinophils Absolute: 0.1 10*3/uL (ref 0.0–0.7)
Lymphs Abs: 1.3 10*3/uL (ref 0.7–4.0)
MCH: 30.2 pg (ref 26.0–34.0)
Neutrophils Relative %: 75 % (ref 43–77)
Platelets: 238 10*3/uL (ref 150–400)
RBC: 3.81 MIL/uL — ABNORMAL LOW (ref 3.87–5.11)
WBC: 12.3 10*3/uL — ABNORMAL HIGH (ref 4.0–10.5)

## 2012-08-24 LAB — BASIC METABOLIC PANEL
Calcium: 9.2 mg/dL (ref 8.4–10.5)
GFR calc non Af Amer: 55 mL/min — ABNORMAL LOW (ref >90)
Glucose, Bld: 145 mg/dL — ABNORMAL HIGH (ref 70–99)
Sodium: 134 mEq/L — ABNORMAL LOW (ref 135–145)

## 2012-08-24 MED ORDER — HYDROCODONE-ACETAMINOPHEN 5-325 MG PO TABS
1.0000 | ORAL_TABLET | ORAL | Status: DC | PRN
  Administered 2012-08-24 – 2012-08-26 (×6): 1 via ORAL
  Filled 2012-08-24 (×6): qty 1

## 2012-08-24 NOTE — Op Note (Signed)
Brooke Gibbs, Brooke Gibbs               ACCOUNT NO.:  [id number]  MEDICAL RECORD NO.:  [id number]  LOCATION:                                 FACILITY:  PHYSICIAN:  J. Darreld Mclean, M.D. DATE OF BIRTH:  Sep 27, 1931  DATE OF PROCEDURE:  08/23/2012 DATE OF DISCHARGE:                              OPERATIVE REPORT   PREOPERATIVE DIAGNOSIS:  (Low femoral neck fracture),  intertrochanteric fracture of the right hip.  POSTOPERATIVE DIAGNOSIS:  (Low femoral neck fracture),  intertrochanteric fracture of the right hip.  PROCEDURE:  Open reduction and internal fixation of the right hip using a Smith and Nephew hip compression system, 105 mm long compression screw.  We used 145 degree short barrel 4 hole side plate was used.  ANESTHESIA:  Spinal.  SURGEON:  J. Darreld Mclean, MD  No blood given.  ESTIMATED BLOOD LOSS:  150 to 200 mL.  DRAINS:  One large Hemovac drain.  The patient fell at home yesterday sustaining above-mentioned injury. She was admitted to the hospital.  Evaluated by the hospitalist, found to be slightly increased risk but acceptable medical risk for the procedure.  I went over the risks and imponderables prior to the procedure.  She appeared to understand and asked appropriate questions.  DESCRIPTION OF PROCEDURE:  The patient was seen in the holding area and again identified the right hip as the correct surgical site.  She placed a mark on the hips.  She was brought to the operating room, given spinal anesthesia, and transferred to the fracture table.  Position alignment was secured on the fracture table.  Hip was reduced on the fracture table.  AP and lateral views using the C-arm fluoroscopy unit, showed good position alignment of the fracture.  Prior to using the x-ray equipment we had a time-out.  Everone had an Apron on  I noted Premarin had an apron on and the lead shields and x-ray patches.  The patient prepped and draped in usual manner.  Again had another  time-out identifying the patient as [name] we are doing her right hip for fracture.  All equipment was working properly and in the room everyone in the room knew each. Incision was made through skin, subcutaneous tissue, tensor fascia lata, vastus lateralis.  The hip was identified.  Guide pin was placed with good AP and lateral views.  Step drill was used in a measured 105 mm compression screw 105 mm was inserted.  Four hole short barrel side plate was then inserted 145 degrees and screw holes made measuring from 46 mm to 42 mm.  Compression was applied to the system. X-rays were taken.  Wound looked good.  Hip was reduced.  Hemovac drain was placed sewn in with 2-0 silk, vastus lateralis, reapproximated using a running number 1 Surgilon suture.  Tensor fascia lata reapproximated using #1 interrupted figure- of-eight suture of the same #1 Bralon.  Subcu tissue reapproximated using 2-0 plain and skin reapproximate the skin staples.  Sterile bulky dressing applied.  The patient will go to recovery in good condition.          ______________________________ Shela Commons. Darreld Mclean, M.D.     JWK/MEDQ  D:  08/23/2012  T:  08/24/2012  Job:  [id number]

## 2012-08-24 NOTE — Addendum Note (Signed)
Addendum  created 08/24/12 0954 by Marolyn Hammock, CRNA   Modules edited:Charges VN

## 2012-08-24 NOTE — Progress Notes (Signed)
Physical Therapy Treatment Patient Details Name: Brooke Gibbs MRN: [id number] DOB: [birth year] Today's Date: 08/24/2012 Time: 1340-1400 PT Time Calculation (min): 20 min 1 therex  PT Assessment / Plan / Recommendation Comments on Treatment Session  Patient somewhat lethargic but able to follow commands. Standing pivot transfer bed<>chair performed;Max A+3 due to patient being unsucessful using RW for earlier attempt.    Follow Up Recommendations  Skilled nursing facility    Barriers to Discharge Decreased caregiver support husband is not in good health    Equipment Recommendations  Defer to next venue    Recommendations for Other Services OT consult  Frequency Min 5X/week   Plan      Precautions / Restrictions Precautions Precautions: Fall Restrictions Weight Bearing Restrictions: Yes RLE Weight Bearing: Touchdown weight bearing   Pertinent Vitals/Pain     Mobility  Bed Mobility Bed Mobility: Supine to Sit;Sit to Supine Supine to Sit: 1: +1 Total assist;HOB elevated;With rails Sit to Supine: Not Tested (comment) Transfers Transfers: Sit to Stand;Stand to Sit;Stand Pivot Transfers Sit to Stand: 2: Max assist;From bed;With upper extremity assist Stand to Sit: 2: Max assist Stand Pivot Transfers: 2: Max assist (no assistive device; +3- patient not able to use UE effectiv) Details for Transfer Assistance: pt is unable to off load any weight from LLE in order to take a step Ambulation/Gait Ambulation/Gait Assistance: Not tested (comment)    Exercises General Exercises - Lower Extremity Ankle Circles/Pumps: 20 reps;Both Quad Sets: 10 reps;Both Gluteal Sets: 10 reps Short Arc Quad: 10 reps;Both Heel Slides: AAROM;Both;10 reps;Supine Hip ABduction/ADduction: 10 reps;Both   PT Diagnosis: Difficulty walking;Generalized weakness;Acute pain  PT Problem List: Decreased strength;Decreased activity tolerance;Decreased mobility;Decreased knowledge of use of DME;Decreased  safety awareness;Decreased knowledge of precautions;Obesity;Pain PT Treatment Interventions: DME instruction;Gait training;Therapeutic activities;Therapeutic exercise;Patient/family education   PT Goals Acute Rehab PT Goals PT Goal Formulation: With patient Time For Goal Achievement: 09/07/12 Potential to Achieve Goals: Good Pt will go Supine/Side to Sit: with max assist;with HOB not 0 degrees (comment degree) PT Goal: Supine/Side to Sit - Progress: Goal set today Pt will go Sit to Supine/Side: with max assist;with HOB not 0 degrees (comment degree) PT Goal: Sit to Supine/Side - Progress: Goal set today Pt will go Sit to Stand: with mod assist;with upper extremity assist PT Goal: Sit to Stand - Progress: Goal set today Pt will go Stand to Sit: with mod assist;with upper extremity assist PT Goal: Stand to Sit - Progress: Goal set today Pt will Transfer Bed to Chair/Chair to Bed: with mod assist PT Transfer Goal: Bed to Chair/Chair to Bed - Progress: Goal set today Pt will Ambulate: 1 - 15 feet;with max assist;with rolling walker PT Goal: Ambulate - Progress: Goal set today  Visit Information  Last PT Received On: 08/24/12    Subjective Data  Subjective: I take care of my husband at home Patient Stated Goal: return to all normal ADLs   Cognition  Overall Cognitive Status: Appears within functional limits for tasks assessed/performed Arousal/Alertness: Awake/alert Orientation Level: Appears intact for tasks assessed Behavior During Session: [facility] for tasks performed    Balance  Balance Balance Assessed: No  End of Session PT - End of Session Equipment Utilized During Treatment: Gait belt Activity Tolerance: Patient limited by pain;Patient limited by fatigue Patient left: in bed;with call bell/phone within reach;with bed alarm set;with family/visitor present Nurse Communication: Mobility status   GP     Tavarius Grewe ATKINSO 08/24/2012, 2:09 PM

## 2012-08-24 NOTE — Progress Notes (Signed)
Subjective: 1 Day Post-Op Procedure(s) (LRB): OPEN REDUCTION INTERNAL FIXATION HIP (Right) Patient reports pain as 4 on 0-10 scale.    Objective: Vital signs in last 24 hours: Temp:  [97.5 F (36.4 C)-100.7 F (38.2 C)] 98.1 F (36.7 C) (09/11 0526) Pulse Rate:  [70-99] 73  (09/11 0526) Resp:  [11-26] 18  (09/11 0526) BP: (94-191)/(57-98) 124/76 mmHg (09/11 0526) SpO2:  [91 %-100 %] 96 % (09/11 0526) FiO2 (%):  [2 %] 2 % (09/10 1054) Weight:  [90.2 kg (198 lb 13.7 oz)] 90.2 kg (198 lb 13.7 oz) (09/11 0526)  Intake/Output from previous day: 09/10 0701 - 09/11 0700 In: 2396 [I.V.:2396] Out: 1490 [Urine:1250; Drains:40; Blood:200] Intake/Output this shift:     Basename 08/24/12 0447 08/22/12 1518  HGB 11.5* 13.5    Basename 08/24/12 0447 08/22/12 1518  WBC 12.3* 16.9*  RBC 3.81* 4.43  HCT 34.9* 40.2  PLT 238 306    Basename 08/24/12 0447 08/22/12 1518  NA 134* 137  K 4.0 3.8  CL 98 100  CO2 30 29  BUN 12 16  CREATININE 0.95 0.79  GLUCOSE 145* 128*  CALCIUM 9.2 9.4   No results found for this basename: LABPT:2,INR:2 in the last 72 hours  Neurologically intact Neurovascular intact Sensation intact distally Intact pulses distally Dorsiflexion/Plantar flexion intact  She had a restless night first part of the night but then slept well.  Her pain is controlled.  She will begin physical therapy today.  Assessment/Plan: 1 Day Post-Op Procedure(s) (LRB): OPEN REDUCTION INTERNAL FIXATION HIP (Right) Up with therapy  Brooke Gibbs 08/24/2012, 7:32 AM

## 2012-08-24 NOTE — Addendum Note (Signed)
Addendum  created 08/24/12 1046 by Moshe Salisbury, CRNA   Modules edited:Notes Section

## 2012-08-24 NOTE — Progress Notes (Signed)
Chart reviewed.  Subjective: Pain controlled.  Objective: Vital signs in last 24 hours: Filed Vitals:   08/24/12 0313 08/24/12 0526 08/24/12 0800 08/24/12 1000  BP:  124/76  120/68  Pulse:  73  77  Temp:  98.1 F (36.7 C)  98 F (36.7 C)  TempSrc:  Oral  Oral  Resp: 12 18 18 20   Height:      Weight:  90.2 kg (198 lb 13.7 oz)    SpO2: 97% 96% 97%    Weight change: 6.738 kg (14 lb 13.7 oz)  Intake/Output Summary (Last 24 hours) at 08/24/12 1154 Last data filed at 08/24/12 0400  Gross per 24 hour  Intake 1874.17 ml  Output   1140 ml  Net 734.17 ml   Gen:  Comfortable. Up in chair. Talking on telephone. Alert and oriented. Lungs clear to auscultation bilaterally without wheeze rhonchi or rales Cardiovascular regular rate rhythm without murmurs gallops rubs Abdomen soft nontender nondistended Extremities no clubbing cyanosis or edema   Lab Results: Basic Metabolic Panel:  Lab 08/24/12 2130 08/22/12 1518  NA 134* 137  K 4.0 3.8  CL 98 100  CO2 30 29  GLUCOSE 145* 128*  BUN 12 16  CREATININE 0.95 0.79  CALCIUM 9.2 9.4  MG -- --  PHOS -- --   Liver Function Tests:  Lab 08/22/12 1518  AST 17  ALT 14  ALKPHOS 81  BILITOT 0.7  PROT 7.2  ALBUMIN 3.7   No results found for this basename: LIPASE:2,AMYLASE:2 in the last 168 hours No results found for this basename: AMMONIA:2 in the last 168 hours CBC:  Lab 08/24/12 0447 08/22/12 1518  WBC 12.3* 16.9*  NEUTROABS 9.2* 14.0*  HGB 11.5* 13.5  HCT 34.9* 40.2  MCV 91.6 90.7  PLT 238 306   Cardiac Enzymes: No results found for this basename: CKTOTAL:3,CKMB:3,CKMBINDEX:3,TROPONINI:3 in the last 168 hours BNP: No results found for this basename: PROBNP:3 in the last 168 hours D-Dimer: No results found for this basename: DDIMER:2 in the last 168 hours CBG: No results found for this basename: GLUCAP:6 in the last 168 hours Hemoglobin A1C: No results found for this basename: HGBA1C in the last 168 hours Fasting  Lipid Panel: No results found for this basename: CHOL,HDL,LDLCALC,TRIG,CHOLHDL,LDLDIRECT in the last 865 hours Thyroid Function Tests:  Lab 08/22/12 1619  TSH 1.621  T4TOTAL --  FREET4 --  T3FREE --  THYROIDAB --   Coagulation: No results found for this basename: LABPROT:4,INR:4 in the last 168 hours Anemia Panel: No results found for this basename: VITAMINB12,FOLATE,FERRITIN,TIBC,IRON,RETICCTPCT in the last 168 hours Urine Drug Screen: Drugs of Abuse  No results found for this basename: labopia, cocainscrnur, labbenz, amphetmu, thcu, labbarb    Alcohol Level: No results found for this basename: ETH:2 in the last 168 hours Urinalysis:  Lab 08/22/12 1710  COLORURINE YELLOW  LABSPEC 1.020  PHURINE 6.0  GLUCOSEU NEGATIVE  HGBUR SMALL*  BILIRUBINUR NEGATIVE  KETONESUR NEGATIVE  PROTEINUR NEGATIVE  UROBILINOGEN 0.2  NITRITE NEGATIVE  LEUKOCYTESUR NEGATIVE   Micro Results: Recent Results (from the past 240 hour(s))  SURGICAL PCR SCREEN     Status: Normal   Collection Time   08/23/12 12:46 AM      Component Value Range Status Comment   MRSA, PCR NEGATIVE  NEGATIVE Final    Staphylococcus aureus NEGATIVE  NEGATIVE Final    Studies/Results: Dg Chest 1 View  08/22/2012  *RADIOLOGY REPORT*  Clinical Data: Fall with right hip pain.  CHEST - 1  VIEW  Comparison: CT chest 01/23/2010.  Findings: Trachea is midline.  Heart size is accentuated by technique.  Lungs are clear.  No pleural fluid.  IMPRESSION: No acute findings.   Original Report Authenticated By: Reyes Ivan, M.D.    Dg Hip Complete Right  08/22/2012  *RADIOLOGY REPORT*  Clinical Data: Fall with lateral right hip pain.  RIGHT HIP - COMPLETE 2+ VIEW  Comparison: None.  Findings: There is a fracture at the base of the right femoral neck with override of fracture fragments and varus angulation.  No dislocation.  Degenerative changes are seen in the spine and symphysis pubis.  Left hip is unremarkable.  IMPRESSION: Right  femoral neck fracture.   Original Report Authenticated By: Reyes Ivan, M.D.    Dg Hip Operative Right  08/23/2012  *RADIOLOGY REPORT*  Clinical Data: Fluoroscopic views obtained during performance of a and ORIF of a right hip fracture  DG OPERATIVE RIGHT HIP  Comparison: 08/22/2012  Findings: Four spot fluoro graphic images of the right hip show reduction of the femoral neck fracture with a compression screw and lateral fixation plate.  The orthopedic hardware is well seated and well-aligned.  The fracture fragments are reduced into near anatomic alignment.  No evidence of an operative complication.   Original Report Authenticated By: Domenic Moras, M.D.    Scheduled Meds:   . acetaminophen  1,000 mg Intravenous Q6H  . antiseptic oral rinse  15 mL Mouth Rinse BID  . calcium-vitamin D  1 tablet Oral Daily  . docusate sodium  100 mg Oral BID  . enoxaparin (LOVENOX) injection  40 mg Subcutaneous Q24H  . LORazepam  1 mg Oral QHS  . multivitamin-lutein  1 capsule Oral Daily  . omega-3 acid ethyl esters  1 g Oral Daily  . pantoprazole  40 mg Oral Q1200  . simvastatin  20 mg Oral QPC supper  . vitamin B-12  100 mcg Oral Daily  . DISCONTD: albuterol  2.5 mg Nebulization Q6H  . DISCONTD:  ceFAZolin (ANCEF) IV  2 g Intravenous 60 min Pre-Op  . DISCONTD: morphine   Intravenous Q4H  . DISCONTD: multivitamin with minerals  1 tablet Oral Daily  . DISCONTD: zolpidem  5 mg Oral QHS   Continuous Infusions:   . DISCONTD: dextrose 5 % and 0.45% NaCl 50 mL/hr at 08/23/12 1445  . DISCONTD: lactated ringers     PRN Meds:.acetaminophen, acetaminophen, albuterol, alum & mag hydroxide-simeth, HYDROcodone-acetaminophen, magnesium hydroxide, naloxone, ondansetron (ZOFRAN) IV, ondansetron, polyethylene glycol, sodium chloride, DISCONTD: diphenhydrAMINE, DISCONTD: diphenhydrAMINE, DISCONTD: fentaNYL, DISCONTD: hydrogen peroxide, DISCONTD: midazolam, DISCONTD: ondansetron (ZOFRAN) IV, DISCONTD: promethazine,  DISCONTD: sodium chloride irrigation Assessment/Plan: Principal Problem:  *Fracture of femoral neck, right Active Problems:  Elevated blood pressure, likely pain related. No previous history of hypertension. Normotensive currently.  Hyperglycemia  HYPERLIPIDEMIA-MIXED  Leukocytosis improved.  Obesity (BMI 30.0-34.9)   LOS: 2 days   Jason Frisbee L 08/24/2012, 11:54 AM

## 2012-08-24 NOTE — Evaluation (Signed)
Physical Therapy Evaluation Patient Details Name: Brooke Gibbs MRN: [id number] DOB: 18-Jan-1931 Today's Date: 08/24/2012 Time: 4098-1191 PT Time Calculation (min): 55 min  PT Assessment / Plan / Recommendation Clinical Impression  Pt is very pleasant and cooperative, pain well coontrolled.  We initiated therapy per hip protocol, starting with teaching of all precautions, ther ex and finally transfer training bed to chair.  She required max assist  to transfer bed to chair using a walker and was unable to off load any weight from LLE in order to take a step.  She will definately need SNF at d/c.          PT Assessment  Patient needs continued PT services    Follow Up Recommendations  Skilled nursing facility    Barriers to Discharge Decreased caregiver support husband is not in good health    Equipment Recommendations  Defer to next venue    Recommendations for Other Services OT consult   Frequency Min 5X/week    Precautions / Restrictions Precautions Precautions: Fall Restrictions Weight Bearing Restrictions: Yes RLE Weight Bearing: Touchdown weight bearing   Pertinent Vitals/Pain       Mobility  Bed Mobility Bed Mobility: Supine to Sit;Sit to Supine Supine to Sit: 1: +1 Total assist;HOB elevated;With rails Sit to Supine: Not Tested (comment) Transfers Transfers: Sit to Stand;Stand to Sit;Stand Pivot Transfers Sit to Stand: 2: Max assist;From bed;With upper extremity assist Stand to Sit: 2: Max assist Stand Pivot Transfers: 2: Max assist (using a walker) Details for Transfer Assistance: pt is unable to off load any weight from LLE in order to take a step Ambulation/Gait Ambulation/Gait Assistance: Not tested (comment)    Exercises General Exercises - Lower Extremity Ankle Circles/Pumps: AROM;Both;10 reps;Supine Quad Sets: AROM;Both;10 reps;Supine Gluteal Sets: AROM;Both;10 reps;Supine Short Arc Quad: AAROM;Both;10 reps;Supine Heel Slides: AAROM;Both;10  reps;Supine Hip ABduction/ADduction: AAROM;Both;10 reps;Supine   PT Diagnosis: Difficulty walking;Generalized weakness;Acute pain  PT Problem List: Decreased strength;Decreased activity tolerance;Decreased mobility;Decreased knowledge of use of DME;Decreased safety awareness;Decreased knowledge of precautions;Obesity;Pain PT Treatment Interventions: DME instruction;Gait training;Therapeutic activities;Therapeutic exercise;Patient/family education   PT Goals Acute Rehab PT Goals PT Goal Formulation: With patient Time For Goal Achievement: 09/07/12 Potential to Achieve Goals: Good Pt will go Supine/Side to Sit: with max assist;with HOB not 0 degrees (comment degree) PT Goal: Supine/Side to Sit - Progress: Goal set today Pt will go Sit to Supine/Side: with max assist;with HOB not 0 degrees (comment degree) PT Goal: Sit to Supine/Side - Progress: Goal set today Pt will go Sit to Stand: with mod assist;with upper extremity assist PT Goal: Sit to Stand - Progress: Goal set today Pt will go Stand to Sit: with mod assist;with upper extremity assist PT Goal: Stand to Sit - Progress: Goal set today Pt will Transfer Bed to Chair/Chair to Bed: with mod assist PT Transfer Goal: Bed to Chair/Chair to Bed - Progress: Goal set today Pt will Ambulate: 1 - 15 feet;with max assist;with rolling walker PT Goal: Ambulate - Progress: Goal set today  Visit Information  Last PT Received On: 08/24/12    Subjective Data  Subjective: I take care of my husband at home Patient Stated Goal: return to all normal ADLs   Prior Functioning  Home Living Lives With: Spouse Available Help at Discharge: Family Type of Home: House Home Access: Stairs to enter Secretary/administrator of Steps: 2 Entrance Stairs-Rails: Right Home Layout: One level Home Adaptive Equipment: Bedside commode/3-in-1 Prior Function Level of Independence: Independent Able to Take Stairs?: Yes  Driving: Yes Vocation:  Retired Musician: No difficulties    Cognition  Overall Cognitive Status: Appears within functional limits for tasks assessed/performed Arousal/Alertness: Awake/alert Orientation Level: Appears intact for tasks assessed Behavior During Session: [facility] for tasks performed    Extremity/Trunk Assessment Right Upper Extremity Assessment RUE ROM/Strength/Tone: Within functional levels Left Upper Extremity Assessment LUE ROM/Strength/Tone: Within functional levels Right Lower Extremity Assessment RLE ROM/Strength/Tone: WFL for tasks assessed RLE Sensation: WFL - Light Touch RLE Coordination: WFL - gross motor Left Lower Extremity Assessment LLE ROM/Strength/Tone: Unable to fully assess;Due to pain LLE Sensation: WFL - Light Touch Trunk Assessment Trunk Assessment: Normal   Balance Balance Balance Assessed: No  End of Session PT - End of Session Equipment Utilized During Treatment: Gait belt Activity Tolerance: Patient tolerated treatment well Patient left: in chair;with call bell/phone within reach;with chair alarm set Nurse Communication: Mobility status  GP     Konrad Penta 08/24/2012, 11:35 AM

## 2012-08-25 DIAGNOSIS — K5900 Constipation, unspecified: Secondary | ICD-10-CM | POA: Diagnosis present

## 2012-08-25 LAB — BASIC METABOLIC PANEL
GFR calc Af Amer: 90 mL/min — ABNORMAL LOW (ref >90)
GFR calc non Af Amer: 77 mL/min — ABNORMAL LOW (ref >90)
Glucose, Bld: 140 mg/dL — ABNORMAL HIGH (ref 70–99)
Potassium: 4 mEq/L (ref 3.5–5.1)
Sodium: 132 mEq/L — ABNORMAL LOW (ref 135–145)

## 2012-08-25 LAB — CBC WITH DIFFERENTIAL/PLATELET
Basophils Absolute: 0 10*3/uL (ref 0.0–0.1)
Basophils Relative: 0 % (ref 0–1)
Eosinophils Absolute: 0.1 10*3/uL (ref 0.0–0.7)
Eosinophils Relative: 1 % (ref 0–5)
HCT: 32.4 % — ABNORMAL LOW (ref 36.0–46.0)
Hemoglobin: 11.1 g/dL — ABNORMAL LOW (ref 12.0–15.0)
Lymphocytes Relative: 9 % — ABNORMAL LOW (ref 12–46)
Lymphs Abs: 1.3 10*3/uL (ref 0.7–4.0)
MCH: 30.8 pg (ref 26.0–34.0)
MCHC: 34.3 g/dL (ref 30.0–36.0)
MCV: 90 fL (ref 78.0–100.0)
Monocytes Absolute: 1.7 10*3/uL — ABNORMAL HIGH (ref 0.1–1.0)
Monocytes Relative: 12 % (ref 3–12)
Neutro Abs: 10.9 10*3/uL — ABNORMAL HIGH (ref 1.7–7.7)
Neutrophils Relative %: 78 % — ABNORMAL HIGH (ref 43–77)
Platelets: 246 10*3/uL (ref 150–400)
RBC: 3.6 MIL/uL — ABNORMAL LOW (ref 3.87–5.11)
RDW: 14.2 % (ref 11.5–15.5)
WBC: 14 10*3/uL — ABNORMAL HIGH (ref 4.0–10.5)

## 2012-08-25 MED ORDER — MAGNESIUM HYDROXIDE 400 MG/5ML PO SUSP
30.0000 mL | Freq: Every day | ORAL | Status: DC
  Administered 2012-08-25 – 2012-08-26 (×2): 30 mL via ORAL
  Filled 2012-08-25 (×2): qty 30

## 2012-08-25 NOTE — Progress Notes (Signed)
Chart reviewed.  Subjective: Pain controlled.  Objective: Vital signs in last 24 hours: Filed Vitals:   08/24/12 1405 08/24/12 1811 08/24/12 2038 08/25/12 0423  BP: 109/66 124/73 130/73 110/67  Pulse: 77 89 112 92  Temp: 98 F (36.7 C) 98.5 F (36.9 C) 99.8 F (37.7 C) 99.7 F (37.6 C)  TempSrc:  Oral Oral Oral  Resp: 18 20 20 20   Height:      Weight:      SpO2: 97% 96% 95% 92%   Weight change:   Intake/Output Summary (Last 24 hours) at 08/25/12 1325 Last data filed at 08/25/12 1305  Gross per 24 hour  Intake    950 ml  Output   2790 ml  Net  -1840 ml   Gen:  Comfortable. Up in chair. Talking on telephone. Alert and oriented. Lungs clear to auscultation bilaterally without wheeze rhonchi or rales Cardiovascular regular rate rhythm without murmurs gallops rubs Abdomen soft nontender nondistended Extremities no clubbing cyanosis or edema   Lab Results: Basic Metabolic Panel:  Lab 08/25/12 7846 08/24/12 0447  NA 132* 134*  K 4.0 4.0  CL 99 98  CO2 27 30  GLUCOSE 140* 145*  BUN 12 12  CREATININE 0.78 0.95  CALCIUM 8.9 9.2  MG -- --  PHOS -- --   Liver Function Tests:  Lab 08/22/12 1518  AST 17  ALT 14  ALKPHOS 81  BILITOT 0.7  PROT 7.2  ALBUMIN 3.7   No results found for this basename: LIPASE:2,AMYLASE:2 in the last 168 hours No results found for this basename: AMMONIA:2 in the last 168 hours CBC:  Lab 08/25/12 0533 08/24/12 0447  WBC 14.0* 12.3*  NEUTROABS 10.9* 9.2*  HGB 11.1* 11.5*  HCT 32.4* 34.9*  MCV 90.0 91.6  PLT 246 238   Cardiac Enzymes: No results found for this basename: CKTOTAL:3,CKMB:3,CKMBINDEX:3,TROPONINI:3 in the last 168 hours BNP: No results found for this basename: PROBNP:3 in the last 168 hours D-Dimer: No results found for this basename: DDIMER:2 in the last 168 hours CBG: No results found for this basename: GLUCAP:6 in the last 168 hours Hemoglobin A1C: No results found for this basename: HGBA1C in the last 168  hours Fasting Lipid Panel: No results found for this basename: CHOL,HDL,LDLCALC,TRIG,CHOLHDL,LDLDIRECT in the last 962 hours Thyroid Function Tests:  Lab 08/22/12 1619  TSH 1.621  T4TOTAL --  FREET4 --  T3FREE --  THYROIDAB --   Coagulation: No results found for this basename: LABPROT:4,INR:4 in the last 168 hours Anemia Panel: No results found for this basename: VITAMINB12,FOLATE,FERRITIN,TIBC,IRON,RETICCTPCT in the last 168 hours Urine Drug Screen: Drugs of Abuse  No results found for this basename: labopia,  cocainscrnur,  labbenz,  amphetmu,  thcu,  labbarb    Alcohol Level: No results found for this basename: ETH:2 in the last 168 hours Urinalysis:  Lab 08/22/12 1710  COLORURINE YELLOW  LABSPEC 1.020  PHURINE 6.0  GLUCOSEU NEGATIVE  HGBUR SMALL*  BILIRUBINUR NEGATIVE  KETONESUR NEGATIVE  PROTEINUR NEGATIVE  UROBILINOGEN 0.2  NITRITE NEGATIVE  LEUKOCYTESUR NEGATIVE   Micro Results: Recent Results (from the past 240 hour(s))  SURGICAL PCR SCREEN     Status: Normal   Collection Time   08/23/12 12:46 AM      Component Value Range Status Comment   MRSA, PCR NEGATIVE  NEGATIVE Final    Staphylococcus aureus NEGATIVE  NEGATIVE Final    Studies/Results: No results found. Scheduled Meds:    . acetaminophen  1,000 mg Intravenous Q6H  .  antiseptic oral rinse  15 mL Mouth Rinse BID  . calcium-vitamin D  1 tablet Oral Daily  . docusate sodium  100 mg Oral BID  . enoxaparin (LOVENOX) injection  40 mg Subcutaneous Q24H  . LORazepam  1 mg Oral QHS  . magnesium hydroxide  30 mL Oral Daily  . multivitamin-lutein  1 capsule Oral Daily  . omega-3 acid ethyl esters  1 g Oral Daily  . pantoprazole  40 mg Oral Q1200  . simvastatin  20 mg Oral QPC supper  . vitamin B-12  100 mcg Oral Daily   Continuous Infusions:  PRN Meds:.acetaminophen, acetaminophen, albuterol, alum & mag hydroxide-simeth, HYDROcodone-acetaminophen, naloxone, ondansetron (ZOFRAN) IV, ondansetron,  polyethylene glycol, sodium chloride, DISCONTD: magnesium hydroxide Assessment/Plan: Principal Problem:  *Fracture of femoral neck, right Active Problems:  Elevated blood pressure, likely pain related. No previous history of hypertension. Normotensive currently.  Hyperglycemia  HYPERLIPIDEMIA-MIXED  Leukocytosis improved.  Obesity (BMI 30.0-34.9) Constipation: Continue Colace. Will give Milk of Magnesia for 3 days. Skilled nursing facility when okay with orthopedics.   LOS: 3 days   Brooke Gibbs L 08/25/2012, 1:25 PM

## 2012-08-25 NOTE — Clinical Social Work Note (Signed)
CSW provided bed offers to pt and pt's daughter and they choose [facility]. Facility notified and initiated [facility] authorization. CSW to continue to follow.  Derenda Fennel, [place] [phone]

## 2012-08-25 NOTE — Clinical Social Work Placement (Signed)
Clinical Social Work Department CLINICAL SOCIAL WORK PLACEMENT NOTE 08/25/2012  Patient:  JODA, BRAATZ  Account Number:  [id number] Admit date:  08/22/2012  Clinical Social Worker:  Derenda Fennel, LCSW  Date/time:  08/23/2012 12:10 PM  Clinical Social Work is seeking post-discharge placement for this patient at the following level of care:   SKILLED NURSING   (*CSW will update this form in Epic as items are completed)   08/23/2012  Patient/family provided with [facility] Department of Clinical Social Work's list of facilities offering this level of care within the geographic area requested by the patient (or if unable, by the patient's family).  08/23/2012  Patient/family informed of their freedom to choose among providers that offer the needed level of care, that participate in Medicare, Medicaid or managed care program needed by the patient, have an available bed and are willing to accept the patient.  08/23/2012  Patient/family informed of [facility]' ownership interest in [facility], as well as of the fact that they are under no obligation to receive care at this facility.  PASARR submitted to [employer]DS on 08/23/2012 PASARR number received from [employer] on 08/23/2012  FL2 transmitted to all facilities in geographic area requested by pt/family on  08/23/2012 FL2 transmitted to all facilities within larger geographic area on   Patient informed that his/her managed care company has contracts with or will negotiate with  certain facilities, including the following:     Patient/family informed of bed offers received:  08/25/2012 Patient chooses bed at [facility] Physician recommends and patient chooses bed at  [facility]  Patient to be transferred to  on   Patient to be transferred to facility by   The following physician request were entered in Epic:   Additional Comments:  Derenda Fennel, LCSW [phone]

## 2012-08-25 NOTE — Progress Notes (Signed)
Subjective: 2 Days Post-Op Procedure(s) (LRB): OPEN REDUCTION INTERNAL FIXATION HIP (Right) Patient reports pain as 4 on 0-10 scale.    Objective: Vital signs in last 24 hours: Temp:  [98 F (36.7 C)-99.8 F (37.7 C)] 99.7 F (37.6 C) (09/12 0423) Pulse Rate:  [77-112] 92  (09/12 0423) Resp:  [18-20] 20  (09/12 0423) BP: (109-130)/(66-73) 110/67 mmHg (09/12 0423) SpO2:  [92 %-97 %] 92 % (09/12 0423)  Intake/Output from previous day: 09/11 0701 - 09/12 0700 In: 240 [P.O.:240] Out: 2790 [Urine:2750; Drains:40] Intake/Output this shift:     Basename 08/25/12 0533 08/24/12 0447 08/22/12 1518  HGB 11.1* 11.5* 13.5    Basename 08/25/12 0533 08/24/12 0447  WBC 14.0* 12.3*  RBC 3.60* 3.81*  HCT 32.4* 34.9*  PLT 246 238    Basename 08/25/12 0533 08/24/12 0447  NA 132* 134*  K 4.0 4.0  CL 99 98  CO2 27 30  BUN 12 12  CREATININE 0.78 0.95  GLUCOSE 140* 145*  CALCIUM 8.9 9.2   No results found for this basename: LABPT:2,INR:2 in the last 72 hours  Neurologically intact Neurovascular intact Sensation intact distally Intact pulses distally Dorsiflexion/Plantar flexion intact Incision: scant drainage  Hemovac drain removed.  Foley has already been removed.  She did fair with PT yesterday.  To continue PT.  Assessment/Plan: 2 Days Post-Op Procedure(s) (LRB): OPEN REDUCTION INTERNAL FIXATION HIP (Right) Up with therapy  Auriel Kist 08/25/2012, 7:57 AM

## 2012-08-25 NOTE — Progress Notes (Signed)
Physical Therapy Treatment Patient Details Name: Brooke Gibbs MRN: [id number] DOB: 1931/02/09 Today's Date: 08/25/2012 Time: 4098-1191 PT Time Calculation (min): 49 min  PT Assessment / Plan / Recommendation Comments on Treatment Session  Pt alert and cooperative.  Pain is well controlled.  She is more participatory with transfers today and was able to do a standing pivot transfer from bed to [facility] to chair with and without walker, TTWB R.  Her UE strength is poor, so she has difficulty offloading weight on LLE in order to take any steps.  Ultimately, she may need platforms on her walker to facilitate gait.    Follow Up Recommendations       Barriers to Discharge        Equipment Recommendations       Recommendations for Other Services    Frequency     Plan Discharge plan remains appropriate;Frequency remains appropriate    Precautions / Restrictions Restrictions Weight Bearing Restrictions: Yes RLE Weight Bearing: Touchdown weight bearing   Pertinent Vitals/Pain     Mobility  Bed Mobility Supine to Sit: 3: Mod assist;HOB elevated;With rails Transfers Sit to Stand: 3: Mod assist;From chair/3-in-1;With upper extremity assist;From bed Stand to Sit: 3: Mod assist;With upper extremity assist;To chair/3-in-1 Stand Pivot Transfers: 3: Mod assist;With armrests Details for Transfer Assistance: transfer is less labored but pt still has difficulty off loading weight from LLE Ambulation/Gait Ambulation/Gait Assistance: Not tested (comment)    Exercises General Exercises - Lower Extremity Ankle Circles/Pumps: AROM;Both;10 reps;Seated Quad Sets: AROM;Both;10 reps;Seated Gluteal Sets: AROM;Both;10 reps;Seated Short Arc Quad: AAROM;Right;10 reps;Seated Long Arc Quad: AAROM;Right;10 reps;Seated Heel Slides: AAROM;Right;Seated Hip ABduction/ADduction: AAROM;Right;10 reps;Seated   PT Diagnosis:    PT Problem List:   PT Treatment Interventions:     PT Goals Acute Rehab PT Goals Pt  will go Supine/Side to Sit: with min assist PT Goal: Supine/Side to Sit - Progress: Updated due to goal met Pt will go Sit to Stand: with min assist PT Goal: Sit to Stand - Progress: Updated due to goal met Pt will go Stand to Sit: with supervision PT Goal: Stand to Sit - Progress: Updated due to goals met Pt will Transfer Bed to Chair/Chair to Bed: with min assist PT Transfer Goal: Bed to Chair/Chair to Bed - Progress: Updated due to goal met PT Goal: Ambulate - Progress: Progressing toward goal  Visit Information  Last PT Received On: 08/25/12    Subjective Data  Subjective: I feel alright   Cognition       Balance     End of Session PT - End of Session Equipment Utilized During Treatment: Gait belt Activity Tolerance: Patient tolerated treatment well Patient left: in chair;with call bell/phone within reach;with chair alarm set;with family/visitor present Nurse Communication: Mobility status   GP     Konrad Penta 08/25/2012, 12:04 PM

## 2012-08-26 ENCOUNTER — Encounter (HOSPITAL_COMMUNITY): Payer: Self-pay | Admitting: Orthopaedic Surgery

## 2012-08-26 DIAGNOSIS — E785 Hyperlipidemia, unspecified: Secondary | ICD-10-CM

## 2012-08-26 LAB — TYPE AND SCREEN: ABO/RH(D): O POS

## 2012-08-26 LAB — CBC WITH DIFFERENTIAL/PLATELET
Basophils Relative: 0 % (ref 0–1)
Eosinophils Absolute: 0.2 10*3/uL (ref 0.0–0.7)
HCT: 31.1 % — ABNORMAL LOW (ref 36.0–46.0)
Hemoglobin: 10.6 g/dL — ABNORMAL LOW (ref 12.0–15.0)
Lymphs Abs: 1.5 10*3/uL (ref 0.7–4.0)
MCH: 30.7 pg (ref 26.0–34.0)
MCHC: 34.1 g/dL (ref 30.0–36.0)
Monocytes Absolute: 1.8 10*3/uL — ABNORMAL HIGH (ref 0.1–1.0)
Monocytes Relative: 15 % — ABNORMAL HIGH (ref 3–12)
RBC: 3.45 MIL/uL — ABNORMAL LOW (ref 3.87–5.11)

## 2012-08-26 LAB — BASIC METABOLIC PANEL
BUN: 14 mg/dL (ref 6–23)
CO2: 28 mEq/L (ref 19–32)
Chloride: 97 mEq/L (ref 96–112)
Glucose, Bld: 126 mg/dL — ABNORMAL HIGH (ref 70–99)
Potassium: 4.1 mEq/L (ref 3.5–5.1)

## 2012-08-26 MED ORDER — HYDROCODONE-ACETAMINOPHEN 5-325 MG PO TABS: 1.0000 | ORAL_TABLET | ORAL | Status: AC | PRN

## 2012-08-26 MED ORDER — HYDROCODONE-ACETAMINOPHEN 5-325 MG PO TABS: 1.0000 | ORAL_TABLET | ORAL | Status: DC | PRN

## 2012-08-26 MED ORDER — LORAZEPAM 1 MG PO TABS: 1.0000 mg | ORAL_TABLET | Freq: Every day | ORAL | Status: AC

## 2012-08-26 MED ORDER — ENOXAPARIN SODIUM 40 MG/0.4ML ~~LOC~~ SOLN: 40.0000 mg | SUBCUTANEOUS | Status: DC

## 2012-08-26 MED ORDER — POLYETHYLENE GLYCOL 3350 17 G PO PACK: 17.0000 g | PACK | Freq: Every day | ORAL | Status: AC

## 2012-08-26 MED ORDER — ONDANSETRON HCL 4 MG PO TABS: 4.0000 mg | ORAL_TABLET | Freq: Four times a day (QID) | ORAL | Status: AC | PRN

## 2012-08-26 MED ORDER — ACETAMINOPHEN 325 MG PO TABS: 650.0000 mg | ORAL_TABLET | Freq: Four times a day (QID) | ORAL | Status: AC | PRN

## 2012-08-26 NOTE — Progress Notes (Signed)
Report called to Marguerite Olea, LPN at the [facility] nursing rehab.  She verbalized understanding and all questions/concerns voiced was answered a this time.  [facility] was notified of transfer need, and this will be reported off to the night nurse.

## 2012-08-26 NOTE — Plan of Care (Signed)
Problem: Phase II Progression Outcomes Goal: Tolerating diet Outcome: Completed/Met Date Met:  08/26/12 No complaints with meals AEB no c/o nausea, vomiting, or abdominal discomfort. Goal: Discharge plan established Outcome: Completed/Met Date Met:  08/26/12 D/c orders plans for [facility].  Problem: Phase III Progression Outcomes Goal: Pain controlled on oral analgesia Outcome: Completed/Met Date Met:  08/26/12 No c/o pain so far today. Goal: Anticoagulant follow-up in place Outcome: Progressing Lovenox scheduled to be given x1 months after surgery.

## 2012-08-26 NOTE — Clinical Social Work Placement (Signed)
Clinical Social Work Department CLINICAL SOCIAL WORK PLACEMENT NOTE 08/26/2012  Patient:  Brooke Gibbs, Brooke Gibbs  Account Number:  [id number] Admit date:  08/22/2012  Clinical Social Worker:  Derenda Fennel, LCSW  Date/time:  08/23/2012 12:10 PM  Clinical Social Work is seeking post-discharge placement for this patient at the following level of care:   SKILLED NURSING   (*CSW will update this form in Epic as items are completed)   08/23/2012  Patient/family provided with [facility] Department of Clinical Social Work's list of facilities offering this level of care within the geographic area requested by the patient (or if unable, by the patient's family).  08/23/2012  Patient/family informed of their freedom to choose among providers that offer the needed level of care, that participate in Medicare, Medicaid or managed care program needed by the patient, have an available bed and are willing to accept the patient.  08/23/2012  Patient/family informed of [facility]' ownership interest in [facility], as well as of the fact that they are under no obligation to receive care at this facility.  PASARR submitted to EDS on 08/23/2012 PASARR number received from EDS on 08/23/2012  FL2 transmitted to all facilities in geographic area requested by pt/family on  08/23/2012 FL2 transmitted to all facilities within larger geographic area on   Patient informed that his/her managed care company has contracts with or will negotiate with  certain facilities, including the following:     Patient/family informed of bed offers received:  08/25/2012 Patient chooses bed at [facility] Physician recommends and patient chooses bed at  [facility]  Patient to be transferred to [facility] on  08/26/2012 Patient to be transferred to facility by [facility]  The following physician request were entered in Epic:   Additional  Comments:  Derenda Fennel, LCSW [phone]

## 2012-08-26 NOTE — Progress Notes (Signed)
Brooke Gibbs was transported via ambulance to SNF. Patient was in no distress and comfortable at the time of her discharge.

## 2012-08-26 NOTE — Progress Notes (Signed)
Subjective: 3 Days Post-Op Procedure(s) (LRB): OPEN REDUCTION INTERNAL FIXATION HIP (Right) Patient reports pain as 3 on 0-10 scale.    Objective: Vital signs in last 24 hours: Temp:  [97.5 F (36.4 C)-100.2 F (37.9 C)] 100 F (37.8 C) (09/13 0551) Pulse Rate:  [85-87] 85  (09/13 0424) Resp:  [20-24] 24  (09/13 0424) BP: (117-124)/(64-77) 124/77 mmHg (09/13 0424) SpO2:  [93 %-97 %] 93 % (09/13 0424)  Intake/Output from previous day: 09/12 0701 - 09/13 0700 In: 1100 [P.O.:1100] Out: 450 [Urine:450] Intake/Output this shift: Total I/O In: 150 [P.O.:150] Out: 450 [Urine:450]   Basename 08/26/12 0505 08/25/12 0533 08/24/12 0447  HGB 10.6* 11.1* 11.5*    Basename 08/26/12 0505 08/25/12 0533  WBC 11.9* 14.0*  RBC 3.45* 3.60*  HCT 31.1* 32.4*  PLT 300 246    Basename 08/26/12 0505 08/25/12 0533  NA 131* 132*  K 4.1 4.0  CL 97 99  CO2 28 27  BUN 14 12  CREATININE 0.80 0.78  GLUCOSE 126* 140*  CALCIUM 8.6 8.9   No results found for this basename: LABPT:2,INR:2 in the last 72 hours  Neurologically intact Neurovascular intact Sensation intact distally Intact pulses distally Dorsiflexion/Plantar flexion intact Incision: scant drainage No cellulitis present  She is making slow progress in physical therapy.  She should be able to go to skilled nursing unit.  She has some blood at distal end of wound but no redness.  Assessment/Plan: 3 Days Post-Op Procedure(s) (LRB): OPEN REDUCTION INTERNAL FIXATION HIP (Right) Discharge to SNF when felt appropriate by hospitalist.  She will need walker, toe touch for one month at least.  I can see her in my office in one month with x-rays of the right hip just prior to the appointment. Call for appointment [phone] or hospital extension [phone].  She will need to continue the enoxaparin for one month daily.  Staples need to be removed on September 20th.  Steri-strip wound then. Continue daily wound  care.  Nethaniel Mattie 08/26/2012, 6:57 AM

## 2012-08-26 NOTE — Clinical Social Work Note (Signed)
CSW received bed offer at [facility] for today. Pt aware and agreeable and requested CSW call her daughter-in-law Britta Mccreedy. Britta Mccreedy and pt's son will complete paperwork at [facility] this evening. Pt to transfer via [facility]. RN to add Ativan script to packet and call EMS when notified from [facility] that pt's family has arrived. D/C summary faxed.   Derenda Fennel, [place] [phone]

## 2012-08-26 NOTE — Discharge Summary (Signed)
Physician Discharge Summary  Patient ID: Brooke Gibbs MRN: [id number] DOB/AGE: 1931/11/08 78 y.o.  Admit date: 08/22/2012 Discharge date: 08/26/2012  Discharge Diagnoses:  Principal Problem:  *Fracture of femoral neck, right Active Problems:  Elevated blood pressure  Hyperglycemia  HYPERLIPIDEMIA-MIXED  Leukocytosis  Obesity (BMI 30.0-34.9)  Constipation    Medication List     As of 08/26/2012 10:58 AM    TAKE these medications         acetaminophen 325 MG tablet   Commonly known as: TYLENOL   Take 2 tablets (650 mg total) by mouth every 6 (six) hours as needed (or Fever >/= 101).      aspirin EC 81 MG tablet   Take 81 mg by mouth daily.      CALCIUM + D PO   Take 1 tablet by mouth daily.      enoxaparin 40 MG/0.4ML injection   Commonly known as: LOVENOX   Inject 0.4 mLs (40 mg total) into the skin daily. For 28 days      fish oil-omega-3 fatty acids 1000 MG capsule   Take 1 g by mouth daily.      HYDROcodone-acetaminophen 5-325 MG per tablet   Commonly known as: NORCO/VICODIN   Take 1 tablet by mouth every 4 (four) hours as needed for pain.      LORazepam 1 MG tablet   Commonly known as: ATIVAN   Take 1 mg by mouth at bedtime.      multivitamin-lutein Caps   Take 1 capsule by mouth daily.      multivitamins ther. w/minerals Tabs   Take 1 tablet by mouth daily.      omeprazole 20 MG capsule   Commonly known as: PRILOSEC   Take 20 mg by mouth daily.      ondansetron 4 MG tablet   Commonly known as: ZOFRAN   Take 1 tablet (4 mg total) by mouth every 6 (six) hours as needed for nausea.      polyethylene glycol packet   Commonly known as: MIRALAX / GLYCOLAX   Take 17 g by mouth daily.      simvastatin 20 MG tablet   Commonly known as: ZOCOR   Take 20 mg by mouth every evening.      VITAMIN B-12 PO   Take 1 tablet by mouth daily.            Discharge Orders    Future Orders Please Complete By Expires   Diet - low sodium heart healthy      Walker       Walk with assistance      Discharge instructions      Comments:   Walker, toe-touch weightbearing for at least one month next  Staples to be removed on September 20, placed Steri-Strips on the wound. Continue daily wound care.  X-ray of right hip in 1 month , bring films  to followup visit with Dr. Hilda Lias in one month. Call to schedule appointment. [phone]      Follow-up Information    Follow up with Darreld Mclean, MD. In 4 months.   Contact information:   [address] [place] [address] [phone]          Disposition: SNF  Discharged Condition: stable  Consults: Treatment Team:  Darreld Mclean, MD  Labs:   Results for orders placed during the hospital encounter of 08/22/12 (from the past 48 hour(s))  BASIC METABOLIC PANEL     Status: Abnormal  Collection Time   08/25/12  5:33 AM      Component Value Range Comment   Sodium 132 (*) 135 - 145 mEq/L    Potassium 4.0  3.5 - 5.1 mEq/L    Chloride 99  96 - 112 mEq/L    CO2 27  19 - 32 mEq/L    Glucose, Bld 140 (*) 70 - 99 mg/dL    BUN 12  6 - 23 mg/dL    Creatinine, Ser 1.61  0.50 - 1.10 mg/dL    Calcium 8.9  8.4 - 09.6 mg/dL    GFR calc non Af Amer 77 (*) >90 mL/min    GFR calc Af Amer 90 (*) >90 mL/min   CBC WITH DIFFERENTIAL     Status: Abnormal   Collection Time   08/25/12  5:33 AM      Component Value Range Comment   WBC 14.0 (*) 4.0 - 10.5 K/uL    RBC 3.60 (*) 3.87 - 5.11 MIL/uL    Hemoglobin 11.1 (*) 12.0 - 15.0 g/dL    HCT 04.5 (*) 40.9 - 46.0 %    MCV 90.0  78.0 - 100.0 fL    MCH 30.8  26.0 - 34.0 pg    MCHC 34.3  30.0 - 36.0 g/dL    RDW 81.1  91.4 - 78.2 %    Platelets 246  150 - 400 K/uL    Neutrophils Relative 78 (*) 43 - 77 %    Neutro Abs 10.9 (*) 1.7 - 7.7 K/uL    Lymphocytes Relative 9 (*) 12 - 46 %    Lymphs Abs 1.3  0.7 - 4.0 K/uL    Monocytes Relative 12  3 - 12 %    Monocytes Absolute 1.7 (*) 0.1 - 1.0 K/uL    Eosinophils Relative 1  0 - 5 %    Eosinophils  Absolute 0.1  0.0 - 0.7 K/uL    Basophils Relative 0  0 - 1 %    Basophils Absolute 0.0  0.0 - 0.1 K/uL   BASIC METABOLIC PANEL     Status: Abnormal   Collection Time   08/26/12  5:05 AM      Component Value Range Comment   Sodium 131 (*) 135 - 145 mEq/L    Potassium 4.1  3.5 - 5.1 mEq/L    Chloride 97  96 - 112 mEq/L    CO2 28  19 - 32 mEq/L    Glucose, Bld 126 (*) 70 - 99 mg/dL    BUN 14  6 - 23 mg/dL    Creatinine, Ser 9.56  0.50 - 1.10 mg/dL    Calcium 8.6  8.4 - 21.3 mg/dL    GFR calc non Af Amer 68 (*) >90 mL/min    GFR calc Af Amer 79 (*) >90 mL/min   CBC WITH DIFFERENTIAL     Status: Abnormal   Collection Time   08/26/12  5:05 AM      Component Value Range Comment   WBC 11.9 (*) 4.0 - 10.5 K/uL    RBC 3.45 (*) 3.87 - 5.11 MIL/uL    Hemoglobin 10.6 (*) 12.0 - 15.0 g/dL    HCT 08.6 (*) 57.8 - 46.0 %    MCV 90.1  78.0 - 100.0 fL    MCH 30.7  26.0 - 34.0 pg    MCHC 34.1  30.0 - 36.0 g/dL    RDW 46.9  62.9 - 52.8 %    Platelets 300  150 - 400 K/uL    Neutrophils Relative 70  43 - 77 %    Neutro Abs 8.4 (*) 1.7 - 7.7 K/uL    Lymphocytes Relative 13  12 - 46 %    Lymphs Abs 1.5  0.7 - 4.0 K/uL    Monocytes Relative 15 (*) 3 - 12 %    Monocytes Absolute 1.8 (*) 0.1 - 1.0 K/uL    Eosinophils Relative 1  0 - 5 %    Eosinophils Absolute 0.2  0.0 - 0.7 K/uL    Basophils Relative 0  0 - 1 %    Basophils Absolute 0.1  0.0 - 0.1 K/uL     Diagnostics:  Dg Chest 1 View  08/22/2012  *RADIOLOGY REPORT*  Clinical Data: Fall with right hip pain.  CHEST - 1 VIEW  Comparison: CT chest 01/23/2010.  Findings: Trachea is midline.  Heart size is accentuated by technique.  Lungs are clear.  No pleural fluid.  IMPRESSION: No acute findings.   Original Report Authenticated By: Reyes Ivan, M.D.    Dg Hip Complete Right  08/22/2012  *RADIOLOGY REPORT*  Clinical Data: Fall with lateral right hip pain.  RIGHT HIP - COMPLETE 2+ VIEW  Comparison: None.  Findings: There is a fracture at the base  of the right femoral neck with override of fracture fragments and varus angulation.  No dislocation.  Degenerative changes are seen in the spine and symphysis pubis.  Left hip is unremarkable.  IMPRESSION: Right femoral neck fracture.   Original Report Authenticated By: Reyes Ivan, M.D.    Dg Hip Operative Right  08/23/2012  *RADIOLOGY REPORT*  Clinical Data: Fluoroscopic views obtained during performance of a and ORIF of a right hip fracture  DG OPERATIVE RIGHT HIP  Comparison: 08/22/2012  Findings: Four spot fluoro graphic images of the right hip show reduction of the femoral neck fracture with a compression screw and lateral fixation plate.  The orthopedic hardware is well seated and well-aligned.  The fracture fragments are reduced into near anatomic alignment.  No evidence of an operative complication.   Original Report Authenticated By: Domenic Moras, M.D.     Procedures: Open reduction internal fixation of right hip by Dr. Hilda Lias on 08/23/2012  Full Code   Hospital Course: See H&P for complete admission details. The patient is an active 78 year old white female who was reaching for a cabinet, became dizzy then fell. She is sustained a right femoral neck fracture. Her blood pressure was 156/73 on admission, but she has no history of hypertension. This was felt to be pain related. She had an elevated white blood cell count but no signs of infection otherwise. Likely stressed margination. She was admitted to the hospitalist service and Dr. Hilda Lias consulted. She had some constipation while here but otherwise uneventful hospitalization. She has been on DVT prophylaxis and will need another month of Lovenox therapy. See above discharge instructions for wound care, activity, followup. Total time on the day of discharge greater than 30 minutes.  Discharge Exam:  Blood pressure 124/77, pulse 85, temperature 100 F (37.8 C), temperature source Oral, resp. rate 24, height 5\' 2"  (1.575 m),  weight 90.2 kg (198 lb 13.7 oz), SpO2 93.00%.  General: Comfortable. Alert and oriented. Seated in chair. Lungs clear to auscultation bilaterally without wheezes rhonchi or rales Cardiovascular regular rate rhythm without murmurs gallops rubs Abdomen soft nontender Extremities TED hose in place. No edema. Right incision without erythema.  SignedChristiane Ha 08/26/2012,  10:58 AM

## 2013-08-23 ENCOUNTER — Encounter (INDEPENDENT_AMBULATORY_CARE_PROVIDER_SITE_OTHER): Payer: Medicare Other | Admitting: Ophthalmology

## 2013-08-23 ENCOUNTER — Encounter (INDEPENDENT_AMBULATORY_CARE_PROVIDER_SITE_OTHER): Payer: Self-pay | Admitting: Ophthalmology

## 2013-08-23 DIAGNOSIS — H43819 Vitreous degeneration, unspecified eye: Secondary | ICD-10-CM

## 2013-08-23 DIAGNOSIS — H3530 Unspecified macular degeneration: Secondary | ICD-10-CM

## 2013-09-27 ENCOUNTER — Encounter (INDEPENDENT_AMBULATORY_CARE_PROVIDER_SITE_OTHER): Payer: Medicare Other | Admitting: Ophthalmology

## 2013-09-27 DIAGNOSIS — H3530 Unspecified macular degeneration: Secondary | ICD-10-CM

## 2013-09-27 DIAGNOSIS — H353290 Exudative age-related macular degeneration, unspecified eye, stage unspecified: Secondary | ICD-10-CM

## 2013-09-27 DIAGNOSIS — H43819 Vitreous degeneration, unspecified eye: Secondary | ICD-10-CM

## 2013-10-25 ENCOUNTER — Encounter (INDEPENDENT_AMBULATORY_CARE_PROVIDER_SITE_OTHER): Payer: Medicare Other | Admitting: Ophthalmology

## 2013-10-25 DIAGNOSIS — H353290 Exudative age-related macular degeneration, unspecified eye, stage unspecified: Secondary | ICD-10-CM

## 2013-10-25 DIAGNOSIS — H43819 Vitreous degeneration, unspecified eye: Secondary | ICD-10-CM

## 2013-10-25 DIAGNOSIS — H3530 Unspecified macular degeneration: Secondary | ICD-10-CM

## 2013-11-22 ENCOUNTER — Encounter (INDEPENDENT_AMBULATORY_CARE_PROVIDER_SITE_OTHER): Payer: Medicare Other | Admitting: Ophthalmology

## 2013-11-22 DIAGNOSIS — H353290 Exudative age-related macular degeneration, unspecified eye, stage unspecified: Secondary | ICD-10-CM

## 2013-11-22 DIAGNOSIS — H3530 Unspecified macular degeneration: Secondary | ICD-10-CM

## 2013-11-22 DIAGNOSIS — H43819 Vitreous degeneration, unspecified eye: Secondary | ICD-10-CM

## 2014-01-03 ENCOUNTER — Encounter (INDEPENDENT_AMBULATORY_CARE_PROVIDER_SITE_OTHER): Payer: Medicare Other | Admitting: Ophthalmology

## 2014-01-03 DIAGNOSIS — H3530 Unspecified macular degeneration: Secondary | ICD-10-CM

## 2014-01-03 DIAGNOSIS — H353290 Exudative age-related macular degeneration, unspecified eye, stage unspecified: Secondary | ICD-10-CM

## 2014-01-03 DIAGNOSIS — H43819 Vitreous degeneration, unspecified eye: Secondary | ICD-10-CM

## 2014-02-21 ENCOUNTER — Encounter (INDEPENDENT_AMBULATORY_CARE_PROVIDER_SITE_OTHER): Payer: Medicare Other | Admitting: Ophthalmology

## 2014-02-28 ENCOUNTER — Encounter (INDEPENDENT_AMBULATORY_CARE_PROVIDER_SITE_OTHER): Payer: Medicare Other | Admitting: Ophthalmology

## 2014-02-28 DIAGNOSIS — H353290 Exudative age-related macular degeneration, unspecified eye, stage unspecified: Secondary | ICD-10-CM

## 2014-02-28 DIAGNOSIS — H3530 Unspecified macular degeneration: Secondary | ICD-10-CM

## 2014-02-28 DIAGNOSIS — H43819 Vitreous degeneration, unspecified eye: Secondary | ICD-10-CM

## 2014-04-25 ENCOUNTER — Encounter (INDEPENDENT_AMBULATORY_CARE_PROVIDER_SITE_OTHER): Payer: Medicare Other | Admitting: Ophthalmology

## 2014-04-25 DIAGNOSIS — H3530 Unspecified macular degeneration: Secondary | ICD-10-CM

## 2014-04-25 DIAGNOSIS — H353290 Exudative age-related macular degeneration, unspecified eye, stage unspecified: Secondary | ICD-10-CM

## 2014-04-25 DIAGNOSIS — H43819 Vitreous degeneration, unspecified eye: Secondary | ICD-10-CM

## 2014-06-20 ENCOUNTER — Encounter (INDEPENDENT_AMBULATORY_CARE_PROVIDER_SITE_OTHER): Payer: Medicare Other | Admitting: Ophthalmology

## 2014-06-20 DIAGNOSIS — H43819 Vitreous degeneration, unspecified eye: Secondary | ICD-10-CM

## 2014-06-20 DIAGNOSIS — H3530 Unspecified macular degeneration: Secondary | ICD-10-CM

## 2014-06-20 DIAGNOSIS — H353290 Exudative age-related macular degeneration, unspecified eye, stage unspecified: Secondary | ICD-10-CM

## 2014-08-29 ENCOUNTER — Encounter (INDEPENDENT_AMBULATORY_CARE_PROVIDER_SITE_OTHER): Payer: Medicare Other | Admitting: Ophthalmology

## 2014-08-29 DIAGNOSIS — H3530 Unspecified macular degeneration: Secondary | ICD-10-CM

## 2014-08-29 DIAGNOSIS — H43819 Vitreous degeneration, unspecified eye: Secondary | ICD-10-CM

## 2014-08-29 DIAGNOSIS — H353290 Exudative age-related macular degeneration, unspecified eye, stage unspecified: Secondary | ICD-10-CM

## 2014-11-14 ENCOUNTER — Encounter (INDEPENDENT_AMBULATORY_CARE_PROVIDER_SITE_OTHER): Payer: Medicare Other | Admitting: Ophthalmology

## 2014-11-22 ENCOUNTER — Encounter (INDEPENDENT_AMBULATORY_CARE_PROVIDER_SITE_OTHER): Payer: Medicare Other | Admitting: Ophthalmology

## 2014-11-22 DIAGNOSIS — H3531 Nonexudative age-related macular degeneration: Secondary | ICD-10-CM

## 2014-11-22 DIAGNOSIS — H3532 Exudative age-related macular degeneration: Secondary | ICD-10-CM

## 2014-11-22 DIAGNOSIS — H43813 Vitreous degeneration, bilateral: Secondary | ICD-10-CM

## 2015-03-21 ENCOUNTER — Encounter (INDEPENDENT_AMBULATORY_CARE_PROVIDER_SITE_OTHER): Payer: Medicare Other | Admitting: Ophthalmology

## 2015-03-21 DIAGNOSIS — H3531 Nonexudative age-related macular degeneration: Secondary | ICD-10-CM

## 2015-03-21 DIAGNOSIS — H3532 Exudative age-related macular degeneration: Secondary | ICD-10-CM | POA: Diagnosis not present

## 2015-03-21 DIAGNOSIS — H43813 Vitreous degeneration, bilateral: Secondary | ICD-10-CM | POA: Diagnosis not present

## 2015-06-13 ENCOUNTER — Encounter (INDEPENDENT_AMBULATORY_CARE_PROVIDER_SITE_OTHER): Payer: Medicare Other | Admitting: Ophthalmology

## 2015-06-13 DIAGNOSIS — H3531 Nonexudative age-related macular degeneration: Secondary | ICD-10-CM

## 2015-06-13 DIAGNOSIS — H43813 Vitreous degeneration, bilateral: Secondary | ICD-10-CM | POA: Diagnosis not present

## 2015-06-13 DIAGNOSIS — H3532 Exudative age-related macular degeneration: Secondary | ICD-10-CM | POA: Diagnosis not present

## 2015-09-05 ENCOUNTER — Encounter (INDEPENDENT_AMBULATORY_CARE_PROVIDER_SITE_OTHER): Payer: Medicare Other | Admitting: Ophthalmology

## 2015-09-05 DIAGNOSIS — H43813 Vitreous degeneration, bilateral: Secondary | ICD-10-CM

## 2015-09-05 DIAGNOSIS — H3531 Nonexudative age-related macular degeneration: Secondary | ICD-10-CM | POA: Diagnosis not present

## 2015-09-05 DIAGNOSIS — H3532 Exudative age-related macular degeneration: Secondary | ICD-10-CM | POA: Diagnosis not present

## 2015-10-17 ENCOUNTER — Encounter (INDEPENDENT_AMBULATORY_CARE_PROVIDER_SITE_OTHER): Payer: Self-pay | Admitting: *Deleted

## 2015-11-06 ENCOUNTER — Other Ambulatory Visit (INDEPENDENT_AMBULATORY_CARE_PROVIDER_SITE_OTHER): Payer: Self-pay | Admitting: *Deleted

## 2015-11-06 DIAGNOSIS — Z86010 Personal history of colonic polyps: Secondary | ICD-10-CM

## 2015-11-20 ENCOUNTER — Telehealth (INDEPENDENT_AMBULATORY_CARE_PROVIDER_SITE_OTHER): Payer: Self-pay | Admitting: *Deleted

## 2015-11-20 DIAGNOSIS — Z1211 Encounter for screening for malignant neoplasm of colon: Secondary | ICD-10-CM

## 2015-11-20 MED ORDER — PEG 3350-KCL-NA BICARB-NACL 420 G PO SOLR: 4000.0000 mL | Freq: Once | ORAL | Status: DC

## 2015-11-20 NOTE — Telephone Encounter (Signed)
Patient needs trilyte 

## 2015-11-25 ENCOUNTER — Telehealth (INDEPENDENT_AMBULATORY_CARE_PROVIDER_SITE_OTHER): Payer: Self-pay | Admitting: *Deleted

## 2015-11-25 NOTE — Telephone Encounter (Signed)
Referring MD/PCP: burdine   Procedure: tcs  Reason/Indication:  Hx polyps  Has patient had this procedure before?  Yes, 2011 -- paper chart  If so, when, by whom and where?    Is there a family history of colon cancer?  no  Who?  What age when diagnosed?    Is patient diabetic?   no      Does patient have prosthetic heart valve or mechanical valve?  no  Do you have a pacemaker?  no  Has patient ever had endocarditis? no  Has patient had joint replacement within last 12 months?  no  Does patient tend to be constipated or take laxatives? yes  Does patient have a history of alcohol/drug use?  no  Is patient on Coumadin, Plavix and/or Aspirin? no  Medications: lorazepam 1 mg at bedtime, simvastatin 20 mg daily, pantoprazole 40 mg prn, meclizine 25 mg prn, tramadol 50 mg prn, triamterene/hctz 75/50 mg prn, flovent inhaler bid, vitamins  Allergies: sulfur drugs, prednisone  Medication Adjustment: asa 2 days  Procedure date & time: 01/09/16 at 100

## 2015-12-30 ENCOUNTER — Encounter (INDEPENDENT_AMBULATORY_CARE_PROVIDER_SITE_OTHER): Payer: Medicare Other | Admitting: Ophthalmology

## 2015-12-30 DIAGNOSIS — H353231 Exudative age-related macular degeneration, bilateral, with active choroidal neovascularization: Secondary | ICD-10-CM | POA: Diagnosis not present

## 2015-12-30 DIAGNOSIS — H43813 Vitreous degeneration, bilateral: Secondary | ICD-10-CM | POA: Diagnosis not present

## 2016-01-09 ENCOUNTER — Ambulatory Visit: Admit: 2016-01-09 | Payer: Medicare Other | Admitting: Internal Medicine

## 2016-01-09 SURGERY — COLONOSCOPY
Anesthesia: Moderate Sedation

## 2016-04-27 ENCOUNTER — Encounter (INDEPENDENT_AMBULATORY_CARE_PROVIDER_SITE_OTHER): Payer: Medicare Other | Admitting: Ophthalmology

## 2016-05-21 ENCOUNTER — Encounter (INDEPENDENT_AMBULATORY_CARE_PROVIDER_SITE_OTHER): Payer: Medicare Other | Admitting: Ophthalmology

## 2016-05-21 DIAGNOSIS — H35033 Hypertensive retinopathy, bilateral: Secondary | ICD-10-CM

## 2016-05-21 DIAGNOSIS — I10 Essential (primary) hypertension: Secondary | ICD-10-CM | POA: Diagnosis not present

## 2016-05-21 DIAGNOSIS — H353231 Exudative age-related macular degeneration, bilateral, with active choroidal neovascularization: Secondary | ICD-10-CM | POA: Diagnosis not present

## 2016-05-21 DIAGNOSIS — H43813 Vitreous degeneration, bilateral: Secondary | ICD-10-CM | POA: Diagnosis not present

## 2016-07-13 ENCOUNTER — Emergency Department (HOSPITAL_COMMUNITY): Payer: Medicare Other

## 2016-07-13 ENCOUNTER — Encounter (HOSPITAL_COMMUNITY): Payer: Self-pay

## 2016-07-13 ENCOUNTER — Inpatient Hospital Stay (HOSPITAL_COMMUNITY)
Admission: EM | Admit: 2016-07-13 | Discharge: 2016-07-17 | DRG: 189 | Disposition: A | Discharge status: Home Health | Payer: Medicare Other | Attending: Internal Medicine | Admitting: Internal Medicine

## 2016-07-13 DIAGNOSIS — Z8673 Personal history of transient ischemic attack (TIA), and cerebral infarction without residual deficits: Secondary | ICD-10-CM

## 2016-07-13 DIAGNOSIS — Z8249 Family history of ischemic heart disease and other diseases of the circulatory system: Secondary | ICD-10-CM

## 2016-07-13 DIAGNOSIS — Z7902 Long term (current) use of antithrombotics/antiplatelets: Secondary | ICD-10-CM

## 2016-07-13 DIAGNOSIS — I4891 Unspecified atrial fibrillation: Secondary | ICD-10-CM | POA: Diagnosis present

## 2016-07-13 DIAGNOSIS — R0602 Shortness of breath: Secondary | ICD-10-CM | POA: Diagnosis not present

## 2016-07-13 DIAGNOSIS — T380X5A Adverse effect of glucocorticoids and synthetic analogues, initial encounter: Secondary | ICD-10-CM | POA: Diagnosis present

## 2016-07-13 DIAGNOSIS — I10 Essential (primary) hypertension: Secondary | ICD-10-CM | POA: Diagnosis present

## 2016-07-13 DIAGNOSIS — R0600 Dyspnea, unspecified: Secondary | ICD-10-CM | POA: Diagnosis present

## 2016-07-13 DIAGNOSIS — J9601 Acute respiratory failure with hypoxia: Secondary | ICD-10-CM | POA: Diagnosis not present

## 2016-07-13 DIAGNOSIS — Z7952 Long term (current) use of systemic steroids: Secondary | ICD-10-CM

## 2016-07-13 DIAGNOSIS — E785 Hyperlipidemia, unspecified: Secondary | ICD-10-CM | POA: Diagnosis present

## 2016-07-13 DIAGNOSIS — H3530 Unspecified macular degeneration: Secondary | ICD-10-CM | POA: Diagnosis present

## 2016-07-13 DIAGNOSIS — R0902 Hypoxemia: Secondary | ICD-10-CM

## 2016-07-13 DIAGNOSIS — Z809 Family history of malignant neoplasm, unspecified: Secondary | ICD-10-CM

## 2016-07-13 DIAGNOSIS — Z823 Family history of stroke: Secondary | ICD-10-CM

## 2016-07-13 DIAGNOSIS — E871 Hypo-osmolality and hyponatremia: Secondary | ICD-10-CM | POA: Diagnosis present

## 2016-07-13 DIAGNOSIS — Z7982 Long term (current) use of aspirin: Secondary | ICD-10-CM

## 2016-07-13 DIAGNOSIS — R739 Hyperglycemia, unspecified: Secondary | ICD-10-CM | POA: Diagnosis present

## 2016-07-13 DIAGNOSIS — D72829 Elevated white blood cell count, unspecified: Secondary | ICD-10-CM | POA: Diagnosis present

## 2016-07-13 DIAGNOSIS — F419 Anxiety disorder, unspecified: Secondary | ICD-10-CM | POA: Diagnosis present

## 2016-07-13 DIAGNOSIS — Z79899 Other long term (current) drug therapy: Secondary | ICD-10-CM

## 2016-07-13 HISTORY — DX: Disorder of kidney and ureter, unspecified: N28.9

## 2016-07-13 HISTORY — DX: Cerebral infarction, unspecified: I63.9

## 2016-07-13 HISTORY — DX: Acute pulmonary edema: J81.0

## 2016-07-13 HISTORY — DX: Essential (primary) hypertension: I10

## 2016-07-13 HISTORY — DX: Impaired fasting glucose: R73.01

## 2016-07-13 LAB — URINE MICROSCOPIC-ADD ON

## 2016-07-13 LAB — CBC WITH DIFFERENTIAL/PLATELET
Basophils Absolute: 0.1 10*3/uL (ref 0.0–0.1)
Basophils Relative: 0 %
EOS PCT: 0 %
Eosinophils Absolute: 0 10*3/uL (ref 0.0–0.7)
HCT: 40.9 % (ref 36.0–46.0)
HEMOGLOBIN: 14 g/dL (ref 12.0–15.0)
LYMPHS ABS: 0.8 10*3/uL (ref 0.7–4.0)
LYMPHS PCT: 4 %
MCH: 29.9 pg (ref 26.0–34.0)
MCHC: 34.2 g/dL (ref 30.0–36.0)
MCV: 87.4 fL (ref 78.0–100.0)
MONOS PCT: 12 %
Monocytes Absolute: 2 10*3/uL — ABNORMAL HIGH (ref 0.1–1.0)
NEUTROS PCT: 84 %
Neutro Abs: 14.8 10*3/uL — ABNORMAL HIGH (ref 1.7–7.7)
Platelets: 504 10*3/uL — ABNORMAL HIGH (ref 150–400)
RBC: 4.68 MIL/uL (ref 3.87–5.11)
RDW: 14.4 % (ref 11.5–15.5)
WBC: 17.7 10*3/uL — AB (ref 4.0–10.5)

## 2016-07-13 LAB — URINALYSIS, ROUTINE W REFLEX MICROSCOPIC
Bilirubin Urine: NEGATIVE
Glucose, UA: NEGATIVE mg/dL
KETONES UR: NEGATIVE mg/dL
Nitrite: NEGATIVE
PROTEIN: NEGATIVE mg/dL
Specific Gravity, Urine: 1.01 (ref 1.005–1.030)
pH: 6 (ref 5.0–8.0)

## 2016-07-13 LAB — GLUCOSE, CAPILLARY: GLUCOSE-CAPILLARY: 140 mg/dL — AB (ref 65–99)

## 2016-07-13 LAB — BASIC METABOLIC PANEL
Anion gap: 8 (ref 5–15)
BUN: 15 mg/dL (ref 6–20)
CHLORIDE: 94 mmol/L — AB (ref 101–111)
CO2: 27 mmol/L (ref 22–32)
Calcium: 8.8 mg/dL — ABNORMAL LOW (ref 8.9–10.3)
Creatinine, Ser: 0.78 mg/dL (ref 0.44–1.00)
GFR calc Af Amer: >60 mL/min (ref >60)
GFR calc non Af Amer: >60 mL/min (ref >60)
GLUCOSE: 118 mg/dL — AB (ref 65–99)
POTASSIUM: 3.7 mmol/L (ref 3.5–5.1)
Sodium: 129 mmol/L — ABNORMAL LOW (ref 135–145)

## 2016-07-13 LAB — TSH: TSH: 1.789 u[IU]/mL (ref 0.350–4.500)

## 2016-07-13 LAB — BRAIN NATRIURETIC PEPTIDE: B Natriuretic Peptide: 99 pg/mL (ref 0.0–100.0)

## 2016-07-13 LAB — SODIUM, URINE, RANDOM: Sodium, Ur: 61 mmol/L

## 2016-07-13 LAB — TROPONIN I: Troponin I: <0.03 ng/mL (ref <0.03)

## 2016-07-13 LAB — D-DIMER, QUANTITATIVE: D-Dimer, Quant: 3.57 ug/mL-FEU — ABNORMAL HIGH (ref 0.00–0.50)

## 2016-07-13 MED ORDER — ASPIRIN EC 81 MG PO TBEC
81.0000 mg | DELAYED_RELEASE_TABLET | Freq: Every day | ORAL | Status: DC
  Administered 2016-07-13 – 2016-07-17 (×5): 81 mg via ORAL
  Filled 2016-07-13 (×5): qty 1

## 2016-07-13 MED ORDER — ALBUTEROL SULFATE (2.5 MG/3ML) 0.083% IN NEBU
5.0000 mg | INHALATION_SOLUTION | Freq: Once | RESPIRATORY_TRACT | Status: AC
  Administered 2016-07-13: 5 mg via RESPIRATORY_TRACT
  Filled 2016-07-13: qty 6

## 2016-07-13 MED ORDER — PREDNISONE 10 MG PO TABS
5.0000 mg | ORAL_TABLET | Freq: Every day | ORAL | Status: DC
  Administered 2016-07-14: 5 mg via ORAL
  Filled 2016-07-13: qty 1

## 2016-07-13 MED ORDER — LORAZEPAM 0.5 MG PO TABS
0.5000 mg | ORAL_TABLET | Freq: Every day | ORAL | Status: DC
  Administered 2016-07-14 – 2016-07-16 (×3): 0.5 mg via ORAL
  Filled 2016-07-13 (×4): qty 1

## 2016-07-13 MED ORDER — SODIUM CHLORIDE 0.9 % IV SOLN
INTRAVENOUS | Status: AC
  Administered 2016-07-13: 21:00:00 via INTRAVENOUS

## 2016-07-13 MED ORDER — FUROSEMIDE 20 MG PO TABS
20.0000 mg | ORAL_TABLET | Freq: Two times a day (BID) | ORAL | Status: DC
  Administered 2016-07-13 – 2016-07-16 (×6): 20 mg via ORAL
  Filled 2016-07-13 (×6): qty 1

## 2016-07-13 MED ORDER — SIMVASTATIN 20 MG PO TABS
20.0000 mg | ORAL_TABLET | Freq: Every evening | ORAL | Status: DC
  Administered 2016-07-13 – 2016-07-16 (×4): 20 mg via ORAL
  Filled 2016-07-13 (×5): qty 1

## 2016-07-13 MED ORDER — LORAZEPAM 1 MG PO TABS
1.0000 mg | ORAL_TABLET | Freq: Every day | ORAL | Status: DC
  Administered 2016-07-13 – 2016-07-16 (×4): 1 mg via ORAL
  Filled 2016-07-13 (×4): qty 1

## 2016-07-13 MED ORDER — DILTIAZEM HCL 25 MG/5ML IV SOLN
20.0000 mg | Freq: Once | INTRAVENOUS | Status: AC
  Administered 2016-07-13: 20 mg via INTRAVENOUS
  Filled 2016-07-13: qty 5

## 2016-07-13 MED ORDER — SODIUM CHLORIDE 0.9% FLUSH
3.0000 mL | Freq: Two times a day (BID) | INTRAVENOUS | Status: DC
  Administered 2016-07-13 – 2016-07-17 (×8): 3 mL via INTRAVENOUS

## 2016-07-13 MED ORDER — LORAZEPAM 0.5 MG PO TABS: 0.5000 mg | ORAL_TABLET | Freq: Two times a day (BID) | ORAL | Status: DC

## 2016-07-13 MED ORDER — IOPAMIDOL (ISOVUE-370) INJECTION 76%
100.0000 mL | Freq: Once | INTRAVENOUS | Status: AC | PRN
  Administered 2016-07-13: 100 mL via INTRAVENOUS

## 2016-07-13 MED ORDER — CLOPIDOGREL BISULFATE 75 MG PO TABS
75.0000 mg | ORAL_TABLET | Freq: Every day | ORAL | Status: DC
  Administered 2016-07-14 – 2016-07-17 (×4): 75 mg via ORAL
  Filled 2016-07-13 (×4): qty 1

## 2016-07-13 MED ORDER — ENOXAPARIN SODIUM 40 MG/0.4ML ~~LOC~~ SOLN
40.0000 mg | SUBCUTANEOUS | Status: DC
  Administered 2016-07-13 – 2016-07-16 (×4): 40 mg via SUBCUTANEOUS
  Filled 2016-07-13 (×4): qty 0.4

## 2016-07-13 MED ORDER — TRAMADOL HCL 50 MG PO TABS
50.0000 mg | ORAL_TABLET | Freq: Three times a day (TID) | ORAL | Status: DC | PRN
  Administered 2016-07-13 – 2016-07-17 (×6): 50 mg via ORAL
  Filled 2016-07-13 (×7): qty 1

## 2016-07-13 NOTE — ED Triage Notes (Signed)
Pt c/o worsening SOB for the past 1 1/2 weeks.  Reports went to urgent care and was sent here for eval of dyspnea and afib.  Denies history of a fib.  Pt c/o pain in lower back and says its from arthritis.

## 2016-07-13 NOTE — H&P (Addendum)
TRH H&P   Patient Demographics:    Brooke Gibbs, is a 82 y.o. female  MRN: [id number]   DOB - 04-24-1931  Admit Date - 07/13/2016  Outpatient Primary MD for the patient is Juliette Alcide, MD  Referring MD/NP/PA:   Eber Hong  Outpatient Specialists: Dr. Ashley Royalty  Patient coming from: home  Chief Complaint  Patient presents with  . Shortness of Breath      HPI:    Genavieve Gibbs  is a 82 y.o. female, w hypertension, hyperlipidemia, CHF ? EF, unknown, apparently c/o dyspnea x a couple of weeks.  especially with walking.  Denies fever, chills, cough, cp, palp. Pt notes slight right leg swelling.  Slight orthopnea. ? Weight gain. Pt presented for evaluation of dyspnea.     In ED,  CTA chest negative for PE.  Showed some basilar scarring.  Pt will be admitted for hypoxia pox 88%,     Review of systems:    In addition to the HPI above,  No Fever-chills, No Headache, No changes with Vision or hearing, No problems swallowing food or Liquids, No Chest pain, Cough No Abdominal pain, No Nausea or Vommitting, Bowel movements are regular, No Blood in stool or Urine, No dysuria, No new skin rashes or bruises, No new joints pains-aches,  No new weakness, tingling, numbness in any extremity, No recent weight gain or loss, No polyuria, polydypsia or polyphagia, No significant Mental Stressors.  A full 10 point Review of Systems was done, except as stated above, all other Review of Systems were negative.   With Past History of the following :    Past Medical History:  Diagnosis Date  . Acute pulmonary edema (HCC)   . Arthritis   . Hyperlipidemia   . Hypertension   . Impaired fasting glucose   . Macular degeneration   . Renal disorder       Past Surgical History:  Procedure Laterality Date  . CATARACT EXTRACTION    . ORIF HIP FRACTURE  08/23/2012   Procedure:  OPEN REDUCTION INTERNAL FIXATION HIP;  Surgeon: Darreld Mclean, MD;  Location: [facility];  Service: Orthopedics;  Laterality: Right;  . TONSILLECTOMY    . TUBAL LIGATION        Social History:     Social History  Substance Use Topics  . Smoking status: Never Smoker  . Smokeless tobacco: Never Used  . Alcohol use No     Lives - at home,  w husband  Mobility - walks with walker w seat     Family History :     Family History  Problem Relation Age of Onset  . Heart failure Mother   . Cancer Sister   . Heart failure Sister   . Stroke Brother   . Kidney disease Daughter       Home Medications:   Prior to Admission medications  Medication Sig Start Date End Date Taking? Authorizing Provider  clopidogrel (PLAVIX) 75 MG tablet Take 75 mg by mouth daily.   Yes Historical Provider, MD  furosemide (LASIX) 20 MG tablet Take 20 mg by mouth 2 (two) times daily.   Yes Historical Provider, MD  LORazepam (ATIVAN) 1 MG tablet Take 1 tablet (1 mg total) by mouth at bedtime. Patient taking differently: Take 0.5-1 mg by mouth 2 (two) times daily. 1/2 tablet in the morning and 1 tablet at bedtime 08/26/12  Yes Christiane Ha, MD  predniSONE (DELTASONE) 5 MG tablet Take 5 mg by mouth daily with breakfast.   Yes Historical Provider, MD  simvastatin (ZOCOR) 20 MG tablet Take 20 mg by mouth every evening.   Yes Historical Provider, MD  traMADol (ULTRAM) 50 MG tablet Take 50 mg by mouth 3 (three) times daily as needed for moderate pain.   Yes Historical Provider, MD  aspirin EC 81 MG tablet Take 81 mg by mouth daily.    Historical Provider, MD  enoxaparin (LOVENOX) 40 MG/0.4ML injection Inject 0.4 mLs (40 mg total) into the skin daily. For 28 days Patient not taking: Reported on 07/13/2016 08/26/12   Christiane Ha, MD  polyethylene glycol-electrolytes (NULYTELY/GOLYTELY) 420 G solution Take 4,000 mLs by mouth once. Patient not taking: Reported on 07/13/2016 11/20/15   Len Blalock, NP      Allergies:     Allergies  Allergen Reactions  . Sulfa Drugs Cross Reactors     Rash  . Prednisone Anxiety     Physical Exam:   Vitals  Blood pressure 132/98, pulse 100, temperature 99 F (37.2 C), temperature source Oral, resp. rate 21, height 5\' 1"  (1.549 m), weight 78 kg (172 lb), SpO2 96 %.   1. General  lying in bed in NAD,   2. Normal affect and insight, Not Suicidal or Homicidal, Awake Alert, Oriented X 3.  3. No F.N deficits, ALL C.Nerves Intact, Strength 5/5 all 4 extremities, Sensation intact all 4 extremities, Plantars down going.  4. Ears and Eyes appear Normal, Conjunctivae clear, PERRLA. Moist Oral Mucosa.  5. Supple Neck, No JVD, No cervical lymphadenopathy appriciated, No Carotid Bruits.  6. Symmetrical Chest wall movement, Good air movement bilaterally, CTAB.  7. RRR, No Gallops, Rubs or Murmurs, No Parasternal Heave.  8. Positive Bowel Sounds, Abdomen Soft, No tenderness, No organomegaly appriciated,No rebound -guarding or rigidity.  9.  No Cyanosis, Normal Skin Turgor, No Skin Rash or Bruise.  10. Good muscle tone,  joints appear normal , no effusions, Normal ROM.  11. No Palpable Lymph Nodes in Neck or Axillae     Data Review:    CBC  Recent Labs Lab 07/13/16 1142  WBC 17.7*  HGB 14.0  HCT 40.9  PLT 504*  MCV 87.4  MCH 29.9  MCHC 34.2  RDW 14.4  LYMPHSABS 0.8  MONOABS 2.0*  EOSABS 0.0  BASOSABS 0.1   ------------------------------------------------------------------------------------------------------------------  Chemistries   Recent Labs Lab 07/13/16 1142  NA 129*  K 3.7  CL 94*  CO2 27  GLUCOSE 118*  BUN 15  CREATININE 0.78  CALCIUM 8.8*   ------------------------------------------------------------------------------------------------------------------ estimated creatinine clearance is 50.4 mL/min (by C-G formula based on SCr of 0.8  mg/dL). ------------------------------------------------------------------------------------------------------------------ No results for input(s): TSH, T4TOTAL, T3FREE, THYROIDAB in the last 72 hours.  Invalid input(s): FREET3  Coagulation profile No results for input(s): INR, PROTIME in the last 168 hours. -------------------------------------------------------------------------------------------------------------------  Recent Labs  07/13/16 1130  DDIMER 3.57*   -------------------------------------------------------------------------------------------------------------------  Cardiac Enzymes  Recent Labs Lab 07/13/16 1142  TROPONINI <0.03   ------------------------------------------------------------------------------------------------------------------    Component Value Date/Time   BNP 99.0 07/13/2016 1146     ---------------------------------------------------------------------------------------------------------------  Urinalysis    Component Value Date/Time   COLORURINE YELLOW 07/13/2016 1700   APPEARANCEUR CLEAR 07/13/2016 1700   LABSPEC 1.010 07/13/2016 1700   PHURINE 6.0 07/13/2016 1700   GLUCOSEU NEGATIVE 07/13/2016 1700   HGBUR SMALL (A) 07/13/2016 1700   BILIRUBINUR NEGATIVE 07/13/2016 1700   KETONESUR NEGATIVE 07/13/2016 1700   PROTEINUR NEGATIVE 07/13/2016 1700   UROBILINOGEN 0.2 08/22/2012 1710   NITRITE NEGATIVE 07/13/2016 1700   LEUKOCYTESUR SMALL (A) 07/13/2016 1700    ----------------------------------------------------------------------------------------------------------------   Imaging Results:    Ct Angio Chest Pe W Or Wo Contrast  Result Date: 07/13/2016 CLINICAL DATA:  Worsening shortness of breath for the past 1-2 weeks. EXAM: CT ANGIOGRAPHY CHEST WITH CONTRAST TECHNIQUE: Multidetector CT imaging of the chest was performed using the standard protocol during bolus administration of intravenous contrast. Multiplanar CT image  reconstructions and MIPs were obtained to evaluate the vascular anatomy. CONTRAST:  100 cc Isovue 370 IV COMPARISON:  01/23/2010 FINDINGS: Cardiovascular: No filling defects in the pulmonary arteries to suggest pulmonary emboli. Mild cardiomegaly. Scattered aortic calcifications. No aneurysm. No dissection. Mediastinum/Nodes: No mediastinal, hilar, or axillary adenopathy. Lungs/Pleura: Elevation of the right hemidiaphragm. Right lower lobe atelectasis or infiltrate. Nodular densities at the left base are unchanged since 2011 compatible with scarring. No pleural effusions. Upper Abdomen: Small gallstones within the gallbladder. No acute findings in the upper abdomen. Musculoskeletal: No acute bony abnormality or focal bone lesion. Review of the MIP images confirms the above findings. IMPRESSION: No evidence of pulmonary embolus. Cardiomegaly. Right base atelectasis.  Left basilar scarring. No acute findings. Cholelithiasis. Electronically Signed   By: Charlett Nose M.D.   On: 07/13/2016 15:36   Dg Chest Portable 1 View  Result Date: 07/13/2016 CLINICAL DATA:  Short of breath for several weeks, worsening over the last 5 days. EXAM: PORTABLE CHEST 1 VIEW COMPARISON:  05/22/2016 FINDINGS: The heart is mildly enlarged. Lungs are very under aerated. There is marked elevation of the right hemidiaphragm. There is bibasilar atelectasis. No pneumothorax. IMPRESSION: Cardiomegaly without edema. Bibasilar atelectasis Marked elevation of the right hemidiaphragm. Electronically Signed   By: Jolaine Click M.D.   On: 07/13/2016 12:24     Assessment & Plan:    Principal Problem:   Hypoxia Active Problems:   Leukocytosis   Hyperglycemia   Hyponatremia   Dyspnea    1. Hypoxia ? Asthma Albuterol prn Pt pox when I examined her was 98% on ra Consider dulera 2puff bid or singulair as outpatient  2. Hyponatremia Hydrate gently with ns iv Check cmp in am  3. Hyperglycemia Check hga1c  4.  Leukocytosis Likely  secondary to chronic prednisone use.  Check cbc in am      DVT Prophylaxis Lovenox - SCDs   AM Labs Ordered, also please review Full Orders  Family Communication: Admission, patients condition and plan of care including tests being ordered have been discussed with the patient who indicate understanding and agree with the plan and Code Status.  Code Status FULL CODE  Likely DC to  home  Condition GUARDED    Consults called: none  Admission status: obs   Time spent in minutes :   Pearson Grippe M.D on 07/13/2016 at 5:49 PM  Between 7am to 7pm - Pager - [phone] After 7pm go to [email or web address] -  password [facility]  [facility] - Office  [phone]

## 2016-07-13 NOTE — ED Notes (Signed)
Nurse unavailable for report.  

## 2016-07-13 NOTE — ED Notes (Signed)
MD at bedside. 

## 2016-07-13 NOTE — ED Notes (Signed)
Returned from CT. Linens changed. Ice chips provided.
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
## 2016-07-13 NOTE — ED Notes (Signed)
In radiology at this time 

## 2016-07-14 DIAGNOSIS — R0902 Hypoxemia: Secondary | ICD-10-CM | POA: Diagnosis not present

## 2016-07-14 LAB — CBC WITH DIFFERENTIAL/PLATELET
Basophils Absolute: 0.1 10*3/uL (ref 0.0–0.1)
Basophils Relative: 1 %
EOS ABS: 0.2 10*3/uL (ref 0.0–0.7)
EOS PCT: 2 %
HCT: 38 % (ref 36.0–46.0)
Hemoglobin: 12.3 g/dL (ref 12.0–15.0)
LYMPHS ABS: 1.6 10*3/uL (ref 0.7–4.0)
Lymphocytes Relative: 19 %
MCH: 28.8 pg (ref 26.0–34.0)
MCHC: 32.4 g/dL (ref 30.0–36.0)
MCV: 89 fL (ref 78.0–100.0)
MONO ABS: 1.4 10*3/uL — AB (ref 0.1–1.0)
MONOS PCT: 17 %
Neutro Abs: 5.1 10*3/uL (ref 1.7–7.7)
Neutrophils Relative %: 61 %
PLATELETS: 488 10*3/uL — AB (ref 150–400)
RBC: 4.27 MIL/uL (ref 3.87–5.11)
RDW: 14.7 % (ref 11.5–15.5)
WBC: 8.4 10*3/uL (ref 4.0–10.5)

## 2016-07-14 LAB — COMPREHENSIVE METABOLIC PANEL
ALBUMIN: 3.3 g/dL — AB (ref 3.5–5.0)
ALT: 17 U/L (ref 14–54)
AST: 19 U/L (ref 15–41)
Alkaline Phosphatase: 134 U/L — ABNORMAL HIGH (ref 38–126)
Anion gap: 9 (ref 5–15)
BUN: 13 mg/dL (ref 6–20)
CHLORIDE: 94 mmol/L — AB (ref 101–111)
CO2: 33 mmol/L — ABNORMAL HIGH (ref 22–32)
CREATININE: 0.72 mg/dL (ref 0.44–1.00)
Calcium: 9 mg/dL (ref 8.9–10.3)
GFR calc Af Amer: >60 mL/min (ref >60)
GLUCOSE: 109 mg/dL — AB (ref 65–99)
Potassium: 3.2 mmol/L — ABNORMAL LOW (ref 3.5–5.1)
Sodium: 136 mmol/L (ref 135–145)
Total Bilirubin: 0.8 mg/dL (ref 0.3–1.2)
Total Protein: 7.1 g/dL (ref 6.5–8.1)

## 2016-07-14 LAB — GLUCOSE, CAPILLARY
GLUCOSE-CAPILLARY: 116 mg/dL — AB (ref 65–99)
GLUCOSE-CAPILLARY: 138 mg/dL — AB (ref 65–99)
Glucose-Capillary: 102 mg/dL — ABNORMAL HIGH (ref 65–99)
Glucose-Capillary: 107 mg/dL — ABNORMAL HIGH (ref 65–99)

## 2016-07-14 LAB — OSMOLALITY: Osmolality: 280 mOsm/kg (ref 275–295)

## 2016-07-14 LAB — OSMOLALITY, URINE: OSMOLALITY UR: 270 mosm/kg — AB (ref 300–900)

## 2016-07-14 MED ORDER — POTASSIUM CHLORIDE CRYS ER 20 MEQ PO TBCR
40.0000 meq | EXTENDED_RELEASE_TABLET | Freq: Once | ORAL | Status: AC
  Administered 2016-07-14: 40 meq via ORAL

## 2016-07-14 MED ORDER — POTASSIUM CHLORIDE CRYS ER 20 MEQ PO TBCR
40.0000 meq | EXTENDED_RELEASE_TABLET | Freq: Once | ORAL | Status: AC
  Administered 2016-07-14: 40 meq via ORAL
  Filled 2016-07-14: qty 2

## 2016-07-14 NOTE — Progress Notes (Signed)
PROGRESS NOTE    Brooke Gibbs  UEK:[id number] DOB: 1931/06/18 DOA: 07/13/2016 PCP: Juliette Alcide, MD     Brief Narrative:  82 year old woman admitted on 7/31 from home due to shortness of breath.   Assessment & Plan:   Principal Problem:   Hypoxia Active Problems:   Leukocytosis   Hyperglycemia   Hyponatremia   Dyspnea   Acute hypoxemic respiratory failure -Oxygen saturations were noted to be 88% on arrival. Chest x-ray as well as CT angiogram have been negative for potential etiology. -Patient states that she had a 2-D echo done recently at [facility], will request records. -She has never been a smoker, CT without evidence for emphysema. -She does take an albuterol inhaler at home so it is possible that she may have asthma as a diagnosis. -At this point etiology of hypoxemia is unclear, right now she is only on 2 L of oxygen and is saturating in the high 90s, will wean oxygen today as I suspect she no longer needs it. -Request physical therapy in a.m. as patient states she has been weak. Can likely discharge back home 24 hours.   DVT prophylaxis: Lovenox Code Status: Full code Family Communication: Daughter-in-law at bedside Disposition Plan: Likely discharge home in 24 hours, await PT evaluation  Consultants:   None  Procedures:   None  Antimicrobials:   None    Subjective: Feels improved complains of back pain,  Objective: Vitals:   07/13/16 2208 07/14/16 0543 07/14/16 1420 07/14/16 1538  BP: (!) 129/96 137/75 137/78   Pulse: 96 75 79   Resp: (!) 24 20 20    Temp: 98.1 F (36.7 C) 98.2 F (36.8 C) 98.1 F (36.7 C)   TempSrc: Oral Oral Oral   SpO2: 96% 95% 97% (!) 2%  Weight:  77.8 kg (171 lb 8 oz)    Height:        Intake/Output Summary (Last 24 hours) at 07/14/16 1618 Last data filed at 07/14/16 1407  Gross per 24 hour  Intake              480 ml  Output             3000 ml  Net            -2520 ml   Filed Weights   07/13/16 1139  07/14/16 0543  Weight: 78 kg (172 lb) 77.8 kg (171 lb 8 oz)    Examination:  General exam: Alert, awake, oriented x 3 Respiratory system: Clear to auscultation. Respiratory effort normal. Cardiovascular system:RRR. No murmurs, rubs, gallops. Gastrointestinal system: Abdomen is nondistended, soft and nontender. No organomegaly or masses felt. Normal bowel sounds heard. Central nervous system: Alert and oriented. No focal neurological deficits. Extremities: No C/C/E, +pedal pulses Skin: No rashes, lesions or ulcers Psychiatry: Judgement and insight appear normal. Mood & affect appropriate.     Data Reviewed: I have personally reviewed following labs and imaging studies  CBC:  Recent Labs Lab 07/13/16 1142 07/14/16 0414  WBC 17.7* 8.4  NEUTROABS 14.8* 5.1  HGB 14.0 12.3  HCT 40.9 38.0  MCV 87.4 89.0  PLT 504* 488*   Basic Metabolic Panel:  Recent Labs Lab 07/13/16 1142 07/14/16 0414  NA 129* 136  K 3.7 3.2*  CL 94* 94*  CO2 27 33*  GLUCOSE 118* 109*  BUN 15 13  CREATININE 0.78 0.72  CALCIUM 8.8* 9.0   GFR: Estimated Creatinine Clearance: 50.3 mL/min (by C-G formula based on SCr of 0.8 mg/dL). Liver  Function Tests:  Recent Labs Lab 07/14/16 0414  AST 19  ALT 17  ALKPHOS 134*  BILITOT 0.8  PROT 7.1  ALBUMIN 3.3*   No results for input(s): LIPASE, AMYLASE in the last 168 hours. No results for input(s): AMMONIA in the last 168 hours. Coagulation Profile: No results for input(s): INR, PROTIME in the last 168 hours. Cardiac Enzymes:  Recent Labs Lab 07/13/16 1142  TROPONINI <0.03   BNP (last 3 results) No results for input(s): PROBNP in the last 8760 hours. HbA1C: No results for input(s): HGBA1C in the last 72 hours. CBG:  Recent Labs Lab 07/13/16 2051 07/14/16 0721 07/14/16 1133  GLUCAP 140* 102* 116*   Lipid Profile: No results for input(s): CHOL, HDL, LDLCALC, TRIG, CHOLHDL, LDLDIRECT in the last 72 hours. Thyroid Function  Tests:  Recent Labs  07/13/16 1142  TSH 1.789   Anemia Panel: No results for input(s): VITAMINB12, FOLATE, FERRITIN, TIBC, IRON, RETICCTPCT in the last 72 hours. Urine analysis:    Component Value Date/Time   COLORURINE YELLOW 07/13/2016 1700   APPEARANCEUR CLEAR 07/13/2016 1700   LABSPEC 1.010 07/13/2016 1700   PHURINE 6.0 07/13/2016 1700   GLUCOSEU NEGATIVE 07/13/2016 1700   HGBUR SMALL (A) 07/13/2016 1700   BILIRUBINUR NEGATIVE 07/13/2016 1700   KETONESUR NEGATIVE 07/13/2016 1700   PROTEINUR NEGATIVE 07/13/2016 1700   UROBILINOGEN 0.2 08/22/2012 1710   NITRITE NEGATIVE 07/13/2016 1700   LEUKOCYTESUR SMALL (A) 07/13/2016 1700   Sepsis Labs: @LABRCNTIP (procalcitonin:4,lacticidven:4)  )No results found for this or any previous visit (from the past 240 hour(s)).       Radiology Studies: Ct Angio Chest Pe W Or Wo Contrast  Result Date: 07/13/2016 CLINICAL DATA:  Worsening shortness of breath for the past 1-2 weeks. EXAM: CT ANGIOGRAPHY CHEST WITH CONTRAST TECHNIQUE: Multidetector CT imaging of the chest was performed using the standard protocol during bolus administration of intravenous contrast. Multiplanar CT image reconstructions and MIPs were obtained to evaluate the vascular anatomy. CONTRAST:  100 cc Isovue 370 IV COMPARISON:  01/23/2010 FINDINGS: Cardiovascular: No filling defects in the pulmonary arteries to suggest pulmonary emboli. Mild cardiomegaly. Scattered aortic calcifications. No aneurysm. No dissection. Mediastinum/Nodes: No mediastinal, hilar, or axillary adenopathy. Lungs/Pleura: Elevation of the right hemidiaphragm. Right lower lobe atelectasis or infiltrate. Nodular densities at the left base are unchanged since 2011 compatible with scarring. No pleural effusions. Upper Abdomen: Small gallstones within the gallbladder. No acute findings in the upper abdomen. Musculoskeletal: No acute bony abnormality or focal bone lesion. Review of the MIP images confirms the  above findings. IMPRESSION: No evidence of pulmonary embolus. Cardiomegaly. Right base atelectasis.  Left basilar scarring. No acute findings. Cholelithiasis. Electronically Signed   By: Charlett Nose M.D.   On: 07/13/2016 15:36   Dg Chest Portable 1 View  Result Date: 07/13/2016 CLINICAL DATA:  Short of breath for several weeks, worsening over the last 5 days. EXAM: PORTABLE CHEST 1 VIEW COMPARISON:  05/22/2016 FINDINGS: The heart is mildly enlarged. Lungs are very under aerated. There is marked elevation of the right hemidiaphragm. There is bibasilar atelectasis. No pneumothorax. IMPRESSION: Cardiomegaly without edema. Bibasilar atelectasis Marked elevation of the right hemidiaphragm. Electronically Signed   By: Jolaine Click M.D.   On: 07/13/2016 12:24       Scheduled Meds: . aspirin EC  81 mg Oral Daily  . clopidogrel  75 mg Oral Daily  . enoxaparin (LOVENOX) injection  40 mg Subcutaneous Q24H  . furosemide  20 mg Oral BID  .  LORazepam  0.5 mg Oral Daily   And  . LORazepam  1 mg Oral QHS  . potassium chloride  40 mEq Oral Once  . potassium chloride  40 mEq Oral Once  . simvastatin  20 mg Oral QPM  . sodium chloride flush  3 mL Intravenous Q12H   Continuous Infusions:    LOS: 0 days    Time spent: 25 minutes. Greater than 50% of this time was spent in direct contact with the patient coordinating care.     Chaya Jan, MD [facility] Pager [phone]  If 7PM-7AM, please contact night-coverage [email or web address] Password [email or web address] 07/14/2016, 4:18 PM

## 2016-07-14 NOTE — Care Management Obs Status (Signed)
MEDICARE OBSERVATION STATUS NOTIFICATION   Patient Details  Name: Brooke Gibbs MRN: [id number] Date of Birth: [birth year]   Medicare Observation Status Notification Given:  Yes    Domonic Kimball, Chrystine Oiler, RN 07/14/2016, 11:47 AM
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
## 2016-07-14 NOTE — Evaluation (Signed)
Physical Therapy Evaluation Patient Details Name: Brooke Gibbs MRN: [id number] DOB: 07-03-1931 Today's Date: 07/14/2016   History of Present Illness  Brooke Gibbs  is a 82 y.o. female, w hypertension, hyperlipidemia, CHF ? EF, unknown, apparently c/o dyspnea x a couple of weeks.  especially with walking.  Denies fever, chills, cough, cp, palp. Pt notes slight right leg swelling.  Slight orthopnea. ? Weight gain. Pt presented for evaluation of dyspnea  Clinical Impression  Brooke Gibbs is an 82 yo who until recently ambulated with a cane.  She has had increased back pain and a TIA that has left her ambulating with a 4-wheeled walker.  Upon evaluation pt is able to complete bed mobility and ambulate with a rolling walker for 90 feet with mod assist.  She will benefit from [facility] therapy but will need 24 hr assistance for safety.  Pt daughter in law states that she has not been taking medication on a regular basis.      Follow Up Recommendations Home health PT;Supervision/Assistance - 24 hour    Equipment Recommendations  None recommended by PT    Recommendations for Other Services       Precautions / Restrictions Precautions Precautions: None Restrictions Weight Bearing Restrictions: No      Mobility  Bed Mobility Overal bed mobility: Modified Independent                Transfers Overall transfer level: Modified independent Equipment used: Rolling walker (2 wheeled)             General transfer comment: Pt normally uses a cane but has had to use a 4 wheeled walker lately.  Ambulation/Gait Ambulation/Gait assistance: Modified independent (Device/Increase time) Ambulation Distance (Feet): 90 Feet Assistive device: Rolling walker (2 wheeled) Gait Pattern/deviations: Trunk flexed   Gait velocity interpretation: Below normal speed for age/gender General Gait Details: with O2  Stairs            Wheelchair Mobility    Modified Rankin (Stroke Patients Only)        Balance                                             Pertinent Vitals/Pain Pain Assessment: No/denies pain    Home Living Family/patient expects to be discharged to:: Private residence Living Arrangements: Spouse/significant other Available Help at Discharge: Family Type of Home: House Home Access: Ramped entrance     Home Layout: One level Home Equipment: Environmental consultant - 4 wheels;Cane - single point;Bedside commode;Tub bench      Prior Function Level of Independence: Independent with assistive device(s)               Hand Dominance        Extremity/Trunk Assessment               Lower Extremity Assessment: Overall WFL for tasks assessed         Communication   Communication: No difficulties  Cognition Arousal/Alertness: Awake/alert                          General Comments      Exercises        Assessment/Plan    PT Assessment Patient needs continued PT services  PT Diagnosis Generalized weakness   PT Problem List Decreased activity tolerance;Decreased balance;Decreased strength  PT Treatment  Interventions Gait training;Functional mobility training;Therapeutic activities;Therapeutic exercise   PT Goals (Current goals can be found in the Care Plan section) Acute Rehab PT Goals Patient Stated Goal: to go home with husband PT Goal Formulation: With patient Potential to Achieve Goals: Fair    Frequency Min 4X/week   Barriers to discharge   Pt husband is at [facility] completing testing.         End of Session Equipment Utilized During Treatment: Gait belt Activity Tolerance: Patient tolerated treatment well (slightly SOB at end of activity) Patient left: in bed;with call bell/phone within reach      Functional Limitation: Mobility: Walking and moving around Mobility: Walking and Moving Around Current Status (R6045): At least 40 percent but less than 60 percent impaired, limited or restricted Mobility: Walking  and Moving Around Goal Status [phone]): At least 40 percent but less than 60 percent impaired, limited or restricted Mobility: Walking and Moving Around Discharge Status [phone]): At least 40 percent but less than 60 percent impaired, limited or restricted    Time: 1540-1616 PT Time Calculation (min) (ACUTE ONLY): 36 min   Charges:         PT G Codes:   PT G-Codes **NOT FOR INPATIENT CLASS** Functional Limitation: Mobility: Walking and moving around Mobility: Walking and Moving Around Current Status (W2956): At least 40 percent but less than 60 percent impaired, limited or restricted Mobility: Walking and Moving Around Goal Status [phone]): At least 40 percent but less than 60 percent impaired, limited or restricted Mobility: Walking and Moving Around Discharge Status [phone]): At least 40 percent but less than 60 percent impaired, limited or restricted    Virgina Organ, PT CLT [phone] 07/14/2016, 4:17 PM

## 2016-07-15 ENCOUNTER — Observation Stay (HOSPITAL_COMMUNITY): Payer: Medicare Other

## 2016-07-15 DIAGNOSIS — R739 Hyperglycemia, unspecified: Secondary | ICD-10-CM

## 2016-07-15 DIAGNOSIS — R0600 Dyspnea, unspecified: Secondary | ICD-10-CM

## 2016-07-15 DIAGNOSIS — R0902 Hypoxemia: Secondary | ICD-10-CM | POA: Diagnosis not present

## 2016-07-15 DIAGNOSIS — E871 Hypo-osmolality and hyponatremia: Secondary | ICD-10-CM | POA: Diagnosis not present

## 2016-07-15 LAB — GLUCOSE, CAPILLARY
GLUCOSE-CAPILLARY: 121 mg/dL — AB (ref 65–99)
GLUCOSE-CAPILLARY: 119 mg/dL — AB (ref 65–99)
GLUCOSE-CAPILLARY: 127 mg/dL — AB (ref 65–99)
Glucose-Capillary: 148 mg/dL — ABNORMAL HIGH (ref 65–99)

## 2016-07-15 LAB — BASIC METABOLIC PANEL
ANION GAP: 8 (ref 5–15)
BUN: 14 mg/dL (ref 6–20)
CHLORIDE: 99 mmol/L — AB (ref 101–111)
CO2: 31 mmol/L (ref 22–32)
Calcium: 9.2 mg/dL (ref 8.9–10.3)
Creatinine, Ser: 0.66 mg/dL (ref 0.44–1.00)
GFR calc Af Amer: >60 mL/min (ref >60)
GFR calc non Af Amer: >60 mL/min (ref >60)
GLUCOSE: 113 mg/dL — AB (ref 65–99)
POTASSIUM: 4.2 mmol/L (ref 3.5–5.1)
Sodium: 138 mmol/L (ref 135–145)

## 2016-07-15 NOTE — Plan of Care (Signed)
Patient home med list confirmed and updated per family's request.

## 2016-07-15 NOTE — Progress Notes (Signed)
Physical Therapy Treatment Patient Details Name: Brooke Gibbs MRN: [id number] DOB: 12/04/1931 Today's Date: 07/15/2016    History of Present Illness Brooke Gibbs  is a 82 y.o. female, w hypertension, hyperlipidemia, CHF ? EF, unknown, apparently c/o dyspnea x a couple of weeks.  especially with walking.  Denies fever, chills, cough, cp, palp. Pt notes slight right leg swelling.  Slight orthopnea. ? Weight gain. Pt presented for evaluation of dyspnea    PT Comments    Pt received sitting up in the chair, and was agreeable to PT tx.  Pt expressed feeling stiff, and having some R hip pain from previous fx today.  Pt ambulated 41ft with Rollator and Min guard.  During ambulation pt desaturated down to 87% while on RA, and required multiple standing rest breaks.  SpO2 improved up to 95-96% on RA after ~72min.  Continue to recommend that pt have HHPT upon d/c, as well as 24/7 supervision/assistance.  She may also need some home O2.    Follow Up Recommendations  Home health PT;Supervision/Assistance - 24 hour     Equipment Recommendations  None recommended by PT (Pt may need home O2 due to desaturation during mobility. )    Recommendations for Other Services       Precautions / Restrictions Precautions Precautions: None Restrictions Weight Bearing Restrictions: No    Mobility  Bed Mobility Overal bed mobility:  (NA - pt sitting up in the chair upon arrival. )                Transfers Overall transfer level: Modified independent Equipment used: 4-wheeled walker             General transfer comment: vc's to push up from the chair to stand, and vc's to reach back for the chair to sit.   Ambulation/Gait Ambulation/Gait assistance: Min guard Ambulation Distance (Feet): 60 Feet Assistive device: 4-wheeled walker Gait Pattern/deviations: Step-through pattern;Antalgic;Wide base of support;Trunk flexed     General Gait Details: Pt ambulated on RA with SpO2 dropping down to  87% while on RA.  Pt required several standing rest breaks due to pain in R hip during gait today.     Stairs            Wheelchair Mobility    Modified Rankin (Stroke Patients Only)       Balance Overall balance assessment: Needs assistance         Standing balance support: Bilateral upper extremity supported                        Cognition Arousal/Alertness: Awake/alert Behavior During Therapy: WFL for tasks assessed/performed Overall Cognitive Status: No family/caregiver present to determine baseline cognitive functioning (Pt states that she is living with her son, but PT eval from yesterday reports that she is living with her husband.  )                      Exercises      General Comments        Pertinent Vitals/Pain Pain Assessment:  (Pt c/o chronic R hip pain but does not rate. )    Home Living                      Prior Function            PT Goals (current goals can now be found in the care plan section) Acute Rehab PT Goals  Patient Stated Goal: to go home with husband PT Goal Formulation: With patient Potential to Achieve Goals: Fair Progress towards PT goals: Progressing toward goals    Frequency  Min 4X/week    PT Plan Current plan remains appropriate    Co-evaluation             End of Session Equipment Utilized During Treatment: Gait belt;Oxygen Activity Tolerance: Patient limited by fatigue;Patient limited by pain Patient left: in chair;with call bell/phone within reach;with chair alarm set     Time: 4431-5400 PT Time Calculation (min) (ACUTE ONLY): 23 min  Charges:  $Gait Training: 23-37 mins                    G Codes:      Beth Yarrow Linhart, PT, DPT X: [phone]

## 2016-07-15 NOTE — Discharge Planning (Signed)
On RA at rest SPO2 95%. On RA with ambulation SPO2 97% On 2L-[place] with ambulation SPO2 96% * Patient will need Home O2 for ambulation if discharged today.

## 2016-07-15 NOTE — Care Management Note (Addendum)
Case Management Note  Patient Details  Name: ARAM DEVINCENT MRN: [id number] Date of Birth: 04/01/1931  Additional Comments: Per PT, patient has desaturation with ambulation and sats increase while resting on room air.  Patient does not have a qualifying diagnosis for oxygen and cannot get oxygen unless she pays out of pocket.   Tayli Buch, Chrystine Oiler, RN 07/15/2016, 11:12 AM

## 2016-07-15 NOTE — Progress Notes (Signed)
[facility] PROGRESS NOTE  Brooke Gibbs WUJ:[id number] DOB: May 07, 1931    PCP:   Juliette Alcide, MD   HPI: Brooke Gibbs is an 82 y.o. female admitted for DOE, but unclear exact etiology, as her CTA showed no PE, no evidence of COPD, and no CHF.  She has low BNP, and negative troponin, with no EKG suggestive of ischemia, and she had no chest pain or wheezing.  I spoke with her PCP this am, Dr Leandrew Koyanagi, and obtain her ECHO result.  It showed EF of 55%, with no significant valvular disease, and grade II diastolic dysfx.  She is improving, but we still don't know exactly why she has DOE.  She did have long term exposure to second hand smoke, as her first husband was a smoker.    Rewiew of Systems:  Constitutional: Negative for malaise, fever and chills. No significant weight loss or weight gain Eyes: Negative for eye pain, redness and discharge, diplopia, visual changes, or flashes of light. ENMT: Negative for ear pain, hoarseness, nasal congestion, sinus pressure and sore throat. No headaches; tinnitus, drooling, or problem swallowing. Cardiovascular: Negative for chest pain, palpitations, diaphoresis, dyspnea and peripheral edema. ; No orthopnea, PND Respiratory: Negative for cough, hemoptysis, wheezing and stridor. No pleuritic chestpain. Gastrointestinal: Negative for nausea, vomiting, diarrhea, constipation, abdominal pain, melena, blood in stool, hematemesis, jaundice and rectal bleeding.    Genitourinary: Negative for frequency, dysuria, incontinence,flank pain and hematuria; Musculoskeletal: Negative for back pain and neck pain. Negative for swelling and trauma.;  Skin: . Negative for pruritus, rash, abrasions, bruising and skin lesion.; ulcerations Neuro: Negative for headache, lightheadedness and neck stiffness. Negative for weakness, altered level of consciousness , altered mental status, extremity weakness, burning feet, involuntary movement, seizure and syncope.  Psych:  negative for anxiety, depression, insomnia, tearfulness, panic attacks, hallucinations, paranoia, suicidal or homicidal ideation    Past Medical History:  Diagnosis Date  . Acute pulmonary edema (HCC)   . Arthritis   . Hyperlipidemia   . Hypertension   . Impaired fasting glucose   . Macular degeneration   . Renal disorder   . Stroke (cerebrum) [facility])     Past Surgical History:  Procedure Laterality Date  . CATARACT EXTRACTION    . ORIF HIP FRACTURE  08/23/2012   Procedure: OPEN REDUCTION INTERNAL FIXATION HIP;  Surgeon: Darreld Mclean, MD;  Location: [facility];  Service: Orthopedics;  Laterality: Right;  . TONSILLECTOMY    . TUBAL LIGATION      Medications:  HOME MEDS: Prior to Admission medications   Medication Sig Start Date End Date Taking? Authorizing Provider  clopidogrel (PLAVIX) 75 MG tablet Take 75 mg by mouth daily.   Yes Historical Provider, MD  furosemide (LASIX) 20 MG tablet Take 20 mg by mouth 2 (two) times daily.   Yes Historical Provider, MD  LORazepam (ATIVAN) 1 MG tablet Take 1 tablet (1 mg total) by mouth at bedtime. Patient taking differently: Take 0.5-1 mg by mouth 2 (two) times daily. 1/2 tablet in the morning and 1 tablet at bedtime 08/26/12  Yes Christiane Ha, MD  predniSONE (DELTASONE) 5 MG tablet Take 5 mg by mouth daily with breakfast.   Yes Historical Provider, MD  simvastatin (ZOCOR) 20 MG tablet Take 20 mg by mouth every evening.   Yes Historical Provider, MD  traMADol (ULTRAM) 50 MG tablet Take 50 mg by mouth 3 (three) times daily as needed for moderate pain.   Yes Historical Provider, MD  aspirin EC  81 MG tablet Take 81 mg by mouth daily.    Historical Provider, MD  enoxaparin (LOVENOX) 40 MG/0.4ML injection Inject 0.4 mLs (40 mg total) into the skin daily. For 28 days Patient not taking: Reported on 07/13/2016 08/26/12   Christiane Ha, MD  polyethylene glycol-electrolytes (NULYTELY/GOLYTELY) 420 G solution Take 4,000 mLs by mouth once. Patient  not taking: Reported on 07/13/2016 11/20/15   Len Blalock, NP     Allergies:  Allergies  Allergen Reactions  . Sulfa Drugs Cross Reactors     Rash  . Prednisone Anxiety    Social History:   reports that she has never smoked. She has never used smokeless tobacco. She reports that she does not drink alcohol or use drugs.  Family History: Family History  Problem Relation Age of Onset  . Heart failure Mother   . Cancer Sister   . Heart failure Sister   . Stroke Brother   . Kidney disease Daughter      Physical Exam: Vitals:   07/14/16 2217 07/15/16 0646 07/15/16 0915 07/15/16 0925  BP: (!) 145/72 132/82    Pulse: 75 95 89 90  Resp: 20 20    Temp: 97.7 F (36.5 C) 98.7 F (37.1 C)    TempSrc: Oral Oral    SpO2: 98% 98% 95% (!) 87%  Weight:  73.9 kg (162 lb 14.7 oz)    Height:       Blood pressure 132/82, pulse 90, temperature 98.7 F (37.1 C), temperature source Oral, resp. rate 20, height 5\' 1"  (1.549 m), weight 73.9 kg (162 lb 14.7 oz), SpO2 (!) 87 %.  GEN:  Pleasant  patient lying in the stretcher in no acute distress; cooperative with exam. PSYCH:  alert and oriented x4; does not appear anxious or depressed; affect is appropriate. HEENT: Mucous membranes pink and anicteric; PERRLA; EOM intact; no cervical lymphadenopathy nor thyromegaly or carotid bruit; no JVD; There were no stridor. Neck is very supple. Breasts:: Not examined CHEST WALL: No tenderness CHEST: Normal respiration, clear to auscultation bilaterally.  HEART: Regular rate and rhythm.  There are no murmur, rub, or gallops.   BACK: No kyphosis or scoliosis; no CVA tenderness ABDOMEN: soft and non-tender; no masses, no organomegaly, normal abdominal bowel sounds; no pannus; no intertriginous candida. There is no rebound and no distention. Rectal Exam: Not done EXTREMITIES: No bone or joint deformity; age-appropriate arthropathy of the hands and knees; no edema; no ulcerations.  There is no calf  tenderness. Genitalia: not examined PULSES: 2+ and symmetric SKIN: Normal hydration no rash or ulceration CNS: Cranial nerves 2-12 grossly intact no focal lateralizing neurologic deficit.  Speech is fluent; uvula elevated with phonation, facial symmetry and tongue midline. DTR are normal bilaterally, cerebella exam is intact, barbinski is negative and strengths are equaled bilaterally.  No sensory loss.   Labs on Admission:  Basic Metabolic Panel:  Recent Labs Lab 07/13/16 1142 07/14/16 0414 07/15/16 0448  NA 129* 136 138  K 3.7 3.2* 4.2  CL 94* 94* 99*  CO2 27 33* 31  GLUCOSE 118* 109* 113*  BUN 15 13 14   CREATININE 0.78 0.72 0.66  CALCIUM 8.8* 9.0 9.2   Liver Function Tests:  Recent Labs Lab 07/14/16 0414  AST 19  ALT 17  ALKPHOS 134*  BILITOT 0.8  PROT 7.1  ALBUMIN 3.3*   CBC:  Recent Labs Lab 07/13/16 1142 07/14/16 0414  WBC 17.7* 8.4  NEUTROABS 14.8* 5.1  HGB 14.0 12.3  HCT  40.9 38.0  MCV 87.4 89.0  PLT 504* 488*   Cardiac Enzymes:  Recent Labs Lab 07/13/16 1142  TROPONINI <0.03    CBG:  Recent Labs Lab 07/14/16 0721 07/14/16 1133 07/14/16 1623 07/14/16 2008 07/15/16 0852  GLUCAP 102* 116* 107* 138* 148*     Radiological Exams on Admission: Ct Angio Chest Pe W Or Wo Contrast  Result Date: 07/13/2016 CLINICAL DATA:  Worsening shortness of breath for the past 1-2 weeks. EXAM: CT ANGIOGRAPHY CHEST WITH CONTRAST TECHNIQUE: Multidetector CT imaging of the chest was performed using the standard protocol during bolus administration of intravenous contrast. Multiplanar CT image reconstructions and MIPs were obtained to evaluate the vascular anatomy. CONTRAST:  100 cc Isovue 370 IV COMPARISON:  01/23/2010 FINDINGS: Cardiovascular: No filling defects in the pulmonary arteries to suggest pulmonary emboli. Mild cardiomegaly. Scattered aortic calcifications. No aneurysm. No dissection. Mediastinum/Nodes: No mediastinal, hilar, or axillary adenopathy.  Lungs/Pleura: Elevation of the right hemidiaphragm. Right lower lobe atelectasis or infiltrate. Nodular densities at the left base are unchanged since 2011 compatible with scarring. No pleural effusions. Upper Abdomen: Small gallstones within the gallbladder. No acute findings in the upper abdomen. Musculoskeletal: No acute bony abnormality or focal bone lesion. Review of the MIP images confirms the above findings. IMPRESSION: No evidence of pulmonary embolus. Cardiomegaly. Right base atelectasis.  Left basilar scarring. No acute findings. Cholelithiasis. Electronically Signed   By: Charlett Nose M.D.   On: 07/13/2016 15:36   Dg Chest Portable 1 View  Result Date: 07/13/2016 CLINICAL DATA:  Short of breath for several weeks, worsening over the last 5 days. EXAM: PORTABLE CHEST 1 VIEW COMPARISON:  05/22/2016 FINDINGS: The heart is mildly enlarged. Lungs are very under aerated. There is marked elevation of the right hemidiaphragm. There is bibasilar atelectasis. No pneumothorax. IMPRESSION: Cardiomegaly without edema. Bibasilar atelectasis Marked elevation of the right hemidiaphragm. Electronically Signed   By: Jolaine Click M.D.   On: 07/13/2016 12:24   EKG: Independently reviewed.   Assessment/Plan Present on Admission: . Dyspnea . Leukocytosis . Hyperglycemia  PLAN:  DOE:  Unclear etiology, but I suspect she has undiagnosed ILD.  Will consult Dr Blase Mess for further recommendation.  She has improved some, and is close to being discharge.  She may benefit seeing Dr Blase Mess as outpatient.  Ischemic CMP is being considered, but I am not convinced.  She will likely benefit seeing a cardiology outpatient, pending what Dr Adah Perl thought as her reason for being SOB.   Leukocytosis:  Improved.   She has no symptoms of adrenal insufficiency.  She is not on Prednisone at this time.    Other plans as per orders. Code Status: FULL Unk Lightning, MD.  FACP [facility] Pager [phone] 7pm  to 7am.  07/15/2016, 10:23 AM

## 2016-07-16 ENCOUNTER — Observation Stay (HOSPITAL_BASED_OUTPATIENT_CLINIC_OR_DEPARTMENT_OTHER): Payer: Medicare Other

## 2016-07-16 DIAGNOSIS — Z8249 Family history of ischemic heart disease and other diseases of the circulatory system: Secondary | ICD-10-CM | POA: Diagnosis not present

## 2016-07-16 DIAGNOSIS — H3530 Unspecified macular degeneration: Secondary | ICD-10-CM | POA: Diagnosis present

## 2016-07-16 DIAGNOSIS — Z823 Family history of stroke: Secondary | ICD-10-CM | POA: Diagnosis not present

## 2016-07-16 DIAGNOSIS — Z7952 Long term (current) use of systemic steroids: Secondary | ICD-10-CM | POA: Diagnosis not present

## 2016-07-16 DIAGNOSIS — F419 Anxiety disorder, unspecified: Secondary | ICD-10-CM | POA: Diagnosis present

## 2016-07-16 DIAGNOSIS — R0902 Hypoxemia: Secondary | ICD-10-CM | POA: Diagnosis not present

## 2016-07-16 DIAGNOSIS — Z809 Family history of malignant neoplasm, unspecified: Secondary | ICD-10-CM | POA: Diagnosis not present

## 2016-07-16 DIAGNOSIS — T380X5A Adverse effect of glucocorticoids and synthetic analogues, initial encounter: Secondary | ICD-10-CM | POA: Diagnosis present

## 2016-07-16 DIAGNOSIS — Z79899 Other long term (current) drug therapy: Secondary | ICD-10-CM | POA: Diagnosis not present

## 2016-07-16 DIAGNOSIS — J9601 Acute respiratory failure with hypoxia: Secondary | ICD-10-CM | POA: Diagnosis present

## 2016-07-16 DIAGNOSIS — R739 Hyperglycemia, unspecified: Secondary | ICD-10-CM | POA: Diagnosis not present

## 2016-07-16 DIAGNOSIS — I4891 Unspecified atrial fibrillation: Secondary | ICD-10-CM | POA: Diagnosis present

## 2016-07-16 DIAGNOSIS — I10 Essential (primary) hypertension: Secondary | ICD-10-CM | POA: Diagnosis present

## 2016-07-16 DIAGNOSIS — R0600 Dyspnea, unspecified: Secondary | ICD-10-CM | POA: Diagnosis not present

## 2016-07-16 DIAGNOSIS — Z7982 Long term (current) use of aspirin: Secondary | ICD-10-CM | POA: Diagnosis not present

## 2016-07-16 DIAGNOSIS — E871 Hypo-osmolality and hyponatremia: Secondary | ICD-10-CM | POA: Diagnosis not present

## 2016-07-16 DIAGNOSIS — Z7902 Long term (current) use of antithrombotics/antiplatelets: Secondary | ICD-10-CM | POA: Diagnosis not present

## 2016-07-16 DIAGNOSIS — Z8673 Personal history of transient ischemic attack (TIA), and cerebral infarction without residual deficits: Secondary | ICD-10-CM | POA: Diagnosis not present

## 2016-07-16 DIAGNOSIS — R0602 Shortness of breath: Secondary | ICD-10-CM | POA: Diagnosis present

## 2016-07-16 DIAGNOSIS — D72829 Elevated white blood cell count, unspecified: Secondary | ICD-10-CM | POA: Diagnosis present

## 2016-07-16 DIAGNOSIS — E785 Hyperlipidemia, unspecified: Secondary | ICD-10-CM | POA: Diagnosis present

## 2016-07-16 LAB — ECHOCARDIOGRAM COMPLETE
AVLVOTPG: 4 mmHg
CHL CUP STROKE VOLUME: 32 mL
E decel time: 232 msec
FS: 33 % (ref 28–44)
Height: 61 in
IVS/LV PW RATIO, ED: 1.03
LA ID, A-P, ES: 38 mm
LADIAMINDEX: 2.1 cm/m2
LAVOL: 50.3 mL
LAVOLA4C: 51 mL
LAVOLIN: 27.8 mL/m2
LDCA: 2.54 cm2
LEFT ATRIUM END SYS DIAM: 38 mm
LV SIMPSON'S DISK: 62
LV dias vol: 52 mL (ref 46–106)
LVDIAVOLIN: 28 mL/m2
LVOT VTI: 18.7 cm
LVOT peak vel: 99.2 cm/s
LVOTD: 18 mm
LVOTSV: 47 mL
LVSYSVOL: 20 mL (ref 14–42)
LVSYSVOLIN: 11 mL/m2
MV Dec: 232
MVPKAVEL: 51.7 m/s
MVPKEVEL: 64 m/s
PW: 12 mm — AB (ref 0.6–1.1)
RV sys press: 18 mmHg
Reg peak vel: 191 cm/s
TAPSE: 18 mm
TR max vel: 191 cm/s
Weight: 2599.66 oz

## 2016-07-16 LAB — GLUCOSE, CAPILLARY
GLUCOSE-CAPILLARY: 125 mg/dL — AB (ref 65–99)
GLUCOSE-CAPILLARY: 126 mg/dL — AB (ref 65–99)
Glucose-Capillary: 120 mg/dL — ABNORMAL HIGH (ref 65–99)

## 2016-07-16 MED ORDER — MOMETASONE FURO-FORMOTEROL FUM 100-5 MCG/ACT IN AERO
2.0000 | INHALATION_SPRAY | Freq: Two times a day (BID) | RESPIRATORY_TRACT | Status: DC
  Administered 2016-07-16 – 2016-07-17 (×2): 2 via RESPIRATORY_TRACT
  Filled 2016-07-16: qty 8.8

## 2016-07-16 MED ORDER — ALBUTEROL SULFATE (2.5 MG/3ML) 0.083% IN NEBU: 2.5000 mg | INHALATION_SOLUTION | Freq: Four times a day (QID) | RESPIRATORY_TRACT | Status: DC

## 2016-07-16 MED ORDER — ALBUTEROL SULFATE (2.5 MG/3ML) 0.083% IN NEBU
2.5000 mg | INHALATION_SOLUTION | Freq: Three times a day (TID) | RESPIRATORY_TRACT | Status: DC
  Administered 2016-07-17 (×2): 2.5 mg via RESPIRATORY_TRACT
  Filled 2016-07-16 (×2): qty 3

## 2016-07-16 MED ORDER — ALBUTEROL SULFATE (2.5 MG/3ML) 0.083% IN NEBU
2.5000 mg | INHALATION_SOLUTION | Freq: Four times a day (QID) | RESPIRATORY_TRACT | Status: DC
  Administered 2016-07-16 (×2): 2.5 mg via RESPIRATORY_TRACT
  Filled 2016-07-16 (×2): qty 3

## 2016-07-16 MED ORDER — FUROSEMIDE 10 MG/ML IJ SOLN
40.0000 mg | Freq: Every day | INTRAMUSCULAR | Status: DC
  Administered 2016-07-16 – 2016-07-17 (×2): 40 mg via INTRAVENOUS
  Filled 2016-07-16 (×2): qty 4

## 2016-07-16 NOTE — Consult Note (Signed)
Consult requested by: [facility] Consult requested for shortness of breath:  HPI: This is an 82 year old who came to the emergency department because of shortness of breath. She also has been having some swelling of her legs. She had apparently been either in the hospital or had other evaluations done about 2 months ago and had echocardiogram with normal systolic function and grade 2 diastolic dysfunction. She says she has been more short of breath over the last several months. She says she wheezes some. She is a lifelong nonsmoker but does have some significant exposure to secondhand smoke during her lifetime. She says she coughs occasionally and rarely coughs anything up. Her wheezing and shortness of breath are worse at night when she lies down. She does not know of any respiratory diagnosis. No history of asthma in childhood  Past Medical History:  Diagnosis Date  . Acute pulmonary edema (HCC)   . Arthritis   . Hyperlipidemia   . Hypertension   . Impaired fasting glucose   . Macular degeneration   . Renal disorder   . Stroke (cerebrum) [facility])      Family History  Problem Relation Age of Onset  . Heart failure Mother   . Cancer Sister   . Heart failure Sister   . Stroke Brother   . Kidney disease Daughter      Social History   Social History  . Marital status: Married    Spouse name: N/A  . Number of children: 9  . Years of education: N/A   Occupational History  . Retired Copy    Social History Main Topics  . Smoking status: Never Smoker  . Smokeless tobacco: Never Used  . Alcohol use No  . Drug use: No  . Sexual activity: No   Other Topics Concern  . None   Social History Narrative   Married.  Lives with husband.  Ambulates independently.  9 children, 2 deceased.     ROS: No hemoptysis. No night sweats. She has had some swelling of her legs. No chest pain. Otherwise per the history and physical which I have reviewed    Objective: Vital signs  in last 24 hours: Temp:  [97.9 F (36.6 C)-98.2 F (36.8 C)] 98 F (36.7 C) (08/03 0643) Pulse Rate:  [66-100] 72 (08/03 0643) Resp:  [18-19] 18 (08/03 0643) BP: (140-171)/(76-83) 144/76 (08/03 0643) SpO2:  [96 %-100 %] 96 % (08/03 0816) Weight:  [73.7 kg (162 lb 7.7 oz)] 73.7 kg (162 lb 7.7 oz) (08/03 0643) Weight change: -0.2 kg (-7.1 oz) Last BM Date: 07/15/16  Intake/Output from previous day: 08/02 0701 - 08/03 0700 In: 603 [P.O.:600; I.V.:3] Out: 1050 [Urine:1050]  PHYSICAL EXAM She is awake and alert. She is mildly dyspneic at rest. Her pupils reactive and throat are clear mucous membranes are moist her neck is supple without masses. Her chest shows decreased breath sounds and some end expiratory wheezing. Her heart is regular without gallop. Abdomen is soft. Extremities showed no edema.  Lab Results: Basic Metabolic Panel:  Recent Labs  16/10/96 0414 07/15/16 0448  NA 136 138  K 3.2* 4.2  CL 94* 99*  CO2 33* 31  GLUCOSE 109* 113*  BUN 13 14  CREATININE 0.72 0.66  CALCIUM 9.0 9.2   Liver Function Tests:  Recent Labs  07/14/16 0414  AST 19  ALT 17  ALKPHOS 134*  BILITOT 0.8  PROT 7.1  ALBUMIN 3.3*   No results for input(s): LIPASE, AMYLASE in the  last 72 hours. No results for input(s): AMMONIA in the last 72 hours. CBC:  Recent Labs  07/13/16 1142 07/14/16 0414  WBC 17.7* 8.4  NEUTROABS 14.8* 5.1  HGB 14.0 12.3  HCT 40.9 38.0  MCV 87.4 89.0  PLT 504* 488*   Cardiac Enzymes:  Recent Labs  07/13/16 1142  TROPONINI <0.03   BNP: No results for input(s): PROBNP in the last 72 hours. D-Dimer:  Recent Labs  07/13/16 1130  DDIMER 3.57*   CBG:  Recent Labs  07/14/16 2008 07/15/16 0852 07/15/16 1136 07/15/16 1654 07/15/16 2059 07/16/16 0802  GLUCAP 138* 148* 121* 119* 127* 125*   Hemoglobin A1C: No results for input(s): HGBA1C in the last 72 hours. Fasting Lipid Panel: No results for input(s): CHOL, HDL, LDLCALC, TRIG, CHOLHDL,  LDLDIRECT in the last 72 hours. Thyroid Function Tests:  Recent Labs  07/13/16 1142  TSH 1.789   Anemia Panel: No results for input(s): VITAMINB12, FOLATE, FERRITIN, TIBC, IRON, RETICCTPCT in the last 72 hours. Coagulation: No results for input(s): LABPROT, INR in the last 72 hours. Urine Drug Screen: Drugs of Abuse  No results found for: LABOPIA, COCAINSCRNUR, LABBENZ, AMPHETMU, THCU, LABBARB  Alcohol Level: No results for input(s): ETH in the last 72 hours. Urinalysis:  Recent Labs  07/13/16 1700  COLORURINE YELLOW  LABSPEC 1.010  PHURINE 6.0  GLUCOSEU NEGATIVE  HGBUR SMALL*  BILIRUBINUR NEGATIVE  KETONESUR NEGATIVE  PROTEINUR NEGATIVE  NITRITE NEGATIVE  LEUKOCYTESUR SMALL*   Misc. Labs:   ABGS: No results for input(s): PHART, PO2ART, TCO2, HCO3 in the last 72 hours.  Invalid input(s): PCO2   MICROBIOLOGY: No results found for this or any previous visit (from the past 240 hour(s)).  Studies/Results: No results found.  Medications:  Prior to Admission:  Prescriptions Prior to Admission  Medication Sig Dispense Refill Last Dose  . albuterol (PROVENTIL HFA;VENTOLIN HFA) 108 (90 Base) MCG/ACT inhaler Inhale 1-2 puffs into the lungs every 4 (four) hours as needed for wheezing or shortness of breath.     . clopidogrel (PLAVIX) 75 MG tablet Take 75 mg by mouth daily.   07/13/2016 at Unknown time  . furosemide (LASIX) 20 MG tablet Take 20 mg by mouth 2 (two) times daily.   07/13/2016 at Unknown time  . LORazepam (ATIVAN) 1 MG tablet Take 1 tablet (1 mg total) by mouth at bedtime. (Patient taking differently: Take 0.5-1 mg by mouth 2 (two) times daily. 1/2 tablet in the morning and 1 tablet at bedtime) 15 tablet 0 07/12/2016 at Unknown time  . predniSONE (DELTASONE) 5 MG tablet Take 5 mg by mouth daily with breakfast.   07/13/2016 at Unknown time  . simvastatin (ZOCOR) 20 MG tablet Take 20 mg by mouth every evening.   07/12/2016 at Unknown time  . traMADol (ULTRAM) 50 MG  tablet Take 50 mg by mouth 3 (three) times daily as needed for moderate pain.   07/13/2016 at 800  . TURMERIC CURCUMIN PO Take 500 mg by mouth 1 day or 1 dose.     Marland Kitchen aspirin EC 81 MG tablet Take 81 mg by mouth daily.   08/21/2012 at Unknown  . enoxaparin (LOVENOX) 40 MG/0.4ML injection Inject 0.4 mLs (40 mg total) into the skin daily. For 28 days (Patient not taking: Reported on 07/13/2016) 0 Syringe  Not Taking at Unknown time  . polyethylene glycol-electrolytes (NULYTELY/GOLYTELY) 420 G solution Take 4,000 mLs by mouth once. (Patient not taking: Reported on 07/13/2016) 4000 mL 0 Not Taking at Unknown  time   Scheduled: . aspirin EC  81 mg Oral Daily  . clopidogrel  75 mg Oral Daily  . enoxaparin (LOVENOX) injection  40 mg Subcutaneous Q24H  . furosemide  40 mg Intravenous Daily  . LORazepam  0.5 mg Oral Daily   And  . LORazepam  1 mg Oral QHS  . mometasone-formoterol  2 puff Inhalation BID  . simvastatin  20 mg Oral QPM  . sodium chloride flush  3 mL Intravenous Q12H   Continuous:  VCB:[id number]  Assesment: She was admitted with shortness of breath and hypoxia. I think this is probably multi-factorial. She could have some element of asthma/COPD. She may have some element of heart failure as well. She apparently had pulmonary function testing done at [facility] but we don't have that information yet. That of course would be helpful in trying to decide what her diagnosis is. I think is reasonable to treat her with bronchodilators and with diuretics at this point. She did not show significant changes of COPD on her CT. Principal Problem:   Hypoxia Active Problems:   Leukocytosis   Hyperglycemia   Hyponatremia   Dyspnea    Plan: Discussed with Dr. Conley Rolls. Plan would be to treat her with bronchodilators and diuretics. I will obtain the outpatient pulmonary function testing.  Thanks for allowing me to see her with you    LOS: 0 days   Khalidah Herbold L 07/16/2016, 10:04 AM

## 2016-07-16 NOTE — Progress Notes (Signed)
*  PRELIMINARY RESULTS* Echocardiogram 2D Echocardiogram has been performed.  Stacey Drain 07/16/2016, 12:30 PM

## 2016-07-16 NOTE — Progress Notes (Signed)
[facility] PROGRESS NOTE  Brooke Gibbs AVW:[id number] DOB: 25-Nov-1931    PCP:   Juliette Alcide, MD   HPI:   Brooke Gibbs is an 82 y.o. female admitted for DOE, but unclear exact etiology, as her CTA showed no PE, no evidence of COPD, and no CHF.  She has low BNP, and negative troponin, with no EKG suggestive of ischemia, and she had no chest pain or wheezing.  I spoke with her PCP this am, Dr Leandrew Koyanagi, and obtain her ECHO result.  It showed EF of 55%, with no significant valvular disease, and grade II diastolic dysfx.  She is improving, but we still don't know exactly why she has DOE.  She did have long term exposure to second hand smoke, as her first husband was a smoker.  Dr Juanetta Gosling has seen her today, and will review her PFTs.   Rewiew of Systems:  Constitutional: Negative for malaise, fever and chills. No significant weight loss or weight gain Eyes: Negative for eye pain, redness and discharge, diplopia, visual changes, or flashes of light. ENMT: Negative for ear pain, hoarseness, nasal congestion, sinus pressure and sore throat. No headaches; tinnitus, drooling, or problem swallowing. Cardiovascular: Negative for chest pain, palpitations, diaphoresis, dyspnea and peripheral edema. ; No orthopnea, PND Respiratory: DOE.  Gastrointestinal: Negative for nausea, vomiting, diarrhea, constipation, abdominal pain, melena, blood in stool, hematemesis, jaundice and rectal bleeding.    Genitourinary: Negative for frequency, dysuria, incontinence,flank pain and hematuria; Musculoskeletal: Negative for back pain and neck pain. Negative for swelling and trauma.;  Skin: . Negative for pruritus, rash, abrasions, bruising and skin lesion.; ulcerations Neuro: Negative for headache, lightheadedness and neck stiffness. Negative for weakness, altered level of consciousness , altered mental status, extremity weakness, burning feet, involuntary movement, seizure and syncope.  Psych: negative for  anxiety, depression, insomnia, tearfulness, panic attacks, hallucinations, paranoia, suicidal or homicidal ideation    Past Medical History:  Diagnosis Date  . Acute pulmonary edema (HCC)   . Arthritis   . Hyperlipidemia   . Hypertension   . Impaired fasting glucose   . Macular degeneration   . Renal disorder   . Stroke (cerebrum) [facility])     Past Surgical History:  Procedure Laterality Date  . CATARACT EXTRACTION    . ORIF HIP FRACTURE  08/23/2012   Procedure: OPEN REDUCTION INTERNAL FIXATION HIP;  Surgeon: Darreld Mclean, MD;  Location: [facility];  Service: Orthopedics;  Laterality: Right;  . TONSILLECTOMY    . TUBAL LIGATION      Medications:  HOME MEDS: Prior to Admission medications   Medication Sig Start Date End Date Taking? Authorizing Provider  albuterol (PROVENTIL HFA;VENTOLIN HFA) 108 (90 Base) MCG/ACT inhaler Inhale 1-2 puffs into the lungs every 4 (four) hours as needed for wheezing or shortness of breath.   Yes Historical Provider, MD  clopidogrel (PLAVIX) 75 MG tablet Take 75 mg by mouth daily.   Yes Historical Provider, MD  furosemide (LASIX) 20 MG tablet Take 20 mg by mouth 2 (two) times daily.   Yes Historical Provider, MD  LORazepam (ATIVAN) 1 MG tablet Take 1 tablet (1 mg total) by mouth at bedtime. Patient taking differently: Take 0.5-1 mg by mouth 2 (two) times daily. 1/2 tablet in the morning and 1 tablet at bedtime 08/26/12  Yes Christiane Ha, MD  predniSONE (DELTASONE) 5 MG tablet Take 5 mg by mouth daily with breakfast.   Yes Historical Provider, MD  simvastatin (ZOCOR) 20 MG tablet Take 20 mg  by mouth every evening.   Yes Historical Provider, MD  traMADol (ULTRAM) 50 MG tablet Take 50 mg by mouth 3 (three) times daily as needed for moderate pain.   Yes Historical Provider, MD  TURMERIC CURCUMIN PO Take 500 mg by mouth 1 day or 1 dose.   Yes Historical Provider, MD  aspirin EC 81 MG tablet Take 81 mg by mouth daily.    Historical Provider, MD  enoxaparin  (LOVENOX) 40 MG/0.4ML injection Inject 0.4 mLs (40 mg total) into the skin daily. For 28 days Patient not taking: Reported on 07/13/2016 08/26/12   Christiane Ha, MD  polyethylene glycol-electrolytes (NULYTELY/GOLYTELY) 420 G solution Take 4,000 mLs by mouth once. Patient not taking: Reported on 07/13/2016 11/20/15   Len Blalock, NP     Allergies:  Allergies  Allergen Reactions  . Sulfa Drugs Cross Reactors     Rash  . Prednisone Anxiety    Social History:   reports that she has never smoked. She has never used smokeless tobacco. She reports that she does not drink alcohol or use drugs.  Family History: Family History  Problem Relation Age of Onset  . Heart failure Mother   . Cancer Sister   . Heart failure Sister   . Stroke Brother   . Kidney disease Daughter      Physical Exam: Vitals:   07/15/16 2056 07/15/16 2308 07/16/16 0643 07/16/16 0816  BP: 140/82  (!) 144/76   Pulse: 66  72   Resp: 19  18   Temp: 97.9 F (36.6 C)  98 F (36.7 C)   TempSrc: Oral  Oral   SpO2: 100% 97% 98% 96%  Weight:   73.7 kg (162 lb 7.7 oz)   Height:       Blood pressure (!) 144/76, pulse 72, temperature 98 F (36.7 C), temperature source Oral, resp. rate 18, height 5\' 1"  (1.549 m), weight 73.7 kg (162 lb 7.7 oz), SpO2 96 %.  GEN:  Pleasant patient lying in the stretcher in no acute distress; cooperative with exam. PSYCH:  alert and oriented x4; does not appear anxious or depressed; affect is appropriate. HEENT: Mucous membranes pink and anicteric; PERRLA; EOM intact; no cervical lymphadenopathy nor thyromegaly or carotid bruit; no JVD; There were no stridor. Neck is very supple. Breasts:: Not examined CHEST WALL: No tenderness CHEST: with no wheezing.  Decrease BS, and no rales. HEART: Regular rate and rhythm.  There are no murmur, rub, or gallops.   BACK: No kyphosis or scoliosis; no CVA tenderness ABDOMEN: soft and non-tender; no masses, no organomegaly, normal abdominal bowel  sounds; no pannus; no intertriginous candida. There is no rebound and no distention. Rectal Exam: Not done EXTREMITIES: No bone or joint deformity; age-appropriate arthropathy of the hands and knees; no edema; no ulcerations.  There is no calf tenderness. Genitalia: not examined PULSES: 2+ and symmetric SKIN: Normal hydration no rash or ulceration CNS: Cranial nerves 2-12 grossly intact no focal lateralizing neurologic deficit.  Speech is fluent; uvula elevated with phonation, facial symmetry and tongue midline. DTR are normal bilaterally, cerebella exam is intact, barbinski is negative and strengths are equaled bilaterally.  No sensory loss.   Labs on Admission:  Basic Metabolic Panel:  Recent Labs Lab 07/13/16 1142 07/14/16 0414 07/15/16 0448  NA 129* 136 138  K 3.7 3.2* 4.2  CL 94* 94* 99*  CO2 27 33* 31  GLUCOSE 118* 109* 113*  BUN 15 13 14   CREATININE 0.78 0.72 0.66  CALCIUM 8.8* 9.0 9.2   Liver Function Tests:  Recent Labs Lab 07/14/16 0414  AST 19  ALT 17  ALKPHOS 134*  BILITOT 0.8  PROT 7.1  ALBUMIN 3.3*   CBC:  Recent Labs Lab 07/13/16 1142 07/14/16 0414  WBC 17.7* 8.4  NEUTROABS 14.8* 5.1  HGB 14.0 12.3  HCT 40.9 38.0  MCV 87.4 89.0  PLT 504* 488*   Cardiac Enzymes:  Recent Labs Lab 07/13/16 1142  TROPONINI <0.03    CBG:  Recent Labs Lab 07/15/16 0852 07/15/16 1136 07/15/16 1654 07/15/16 2059 07/16/16 0802  GLUCAP 148* 121* 119* 127* 125*    Assessment/Plan Present on Admission: . Dyspnea . Leukocytosis . Hyperglycemia  PLAN:  DOE:  Unclear etiology, but I suspect she has undiagnosed ILD.  She has improved some, and is close to being discharged.  Have consulted Dr Juanetta Gosling, and he will review her PFTs.  Will Tx with inhalers.  Ischemic CMP is being considered, but I am not convinced.  She will likely benefit seeing a cardiology outpatient.  She doesn't qualify for home oxygen.   Leukocytosis:  Improved.   She has no symptoms of  adrenal insufficiency.  She is not on Prednisone at this time.   Code Status: FULL Unk Lightning, MD.  FACP [facility] Pager [phone] 7pm to 7am.  07/16/2016, 10:05 AM

## 2016-07-17 DIAGNOSIS — D72829 Elevated white blood cell count, unspecified: Secondary | ICD-10-CM

## 2016-07-17 MED ORDER — MOMETASONE FURO-FORMOTEROL FUM 100-5 MCG/ACT IN AERO: 2.0000 | INHALATION_SPRAY | Freq: Two times a day (BID) | RESPIRATORY_TRACT | 5 refills | Status: AC

## 2016-07-17 NOTE — Progress Notes (Signed)
Subjective: She says she is breathing better. She complains of pain in her left leg. This is in the anterior surface. She's not coughing. She looks more comfortable and is off oxygen right now  Objective: Vital signs in last 24 hours: Temp:  [97.9 F (36.6 C)-98.7 F (37.1 C)] 98.1 F (36.7 C) (08/04 0614) Pulse Rate:  [69-100] 100 (08/04 0614) Resp:  [16-18] 16 (08/04 0614) BP: (120-130)/(62-79) 125/79 (08/04 0614) SpO2:  [93 %-100 %] 93 % (08/04 0728) Weight:  [73.7 kg (162 lb 6.4 oz)] 73.7 kg (162 lb 6.4 oz) (08/04 1610) Weight change: -0.036 kg (-1.3 oz) Last BM Date: 07/15/16  Intake/Output from previous day: 08/03 0701 - 08/04 0700 In: 483 [P.O.:480; I.V.:3] Out: -   PHYSICAL EXAM General appearance: alert, cooperative and mild distress Resp: clear to auscultation bilaterally Cardio: regular rate and rhythm, S1, S2 normal, no murmur, click, rub or gallop GI: soft, non-tender; bowel sounds normal; no masses,  no organomegaly Extremities: Mild tenderness left anterior calf  Lab Results:  Results for orders placed or performed during the hospital encounter of 07/13/16 (from the past 48 hour(s))  Glucose, capillary     Status: Abnormal   Collection Time: 07/15/16  8:52 AM  Result Value Ref Range   Glucose-Capillary 148 (H) 65 - 99 mg/dL   Comment 1 Notify RN    Comment 2 Document in Chart   Glucose, capillary     Status: Abnormal   Collection Time: 07/15/16 11:36 AM  Result Value Ref Range   Glucose-Capillary 121 (H) 65 - 99 mg/dL  Glucose, capillary     Status: Abnormal   Collection Time: 07/15/16  4:54 PM  Result Value Ref Range   Glucose-Capillary 119 (H) 65 - 99 mg/dL   Comment 1 Notify RN   Glucose, capillary     Status: Abnormal   Collection Time: 07/15/16  8:59 PM  Result Value Ref Range   Glucose-Capillary 127 (H) 65 - 99 mg/dL  Glucose, capillary     Status: Abnormal   Collection Time: 07/16/16  8:02 AM  Result Value Ref Range   Glucose-Capillary 125  (H) 65 - 99 mg/dL   Comment 1 Notify RN   Glucose, capillary     Status: Abnormal   Collection Time: 07/16/16 11:25 AM  Result Value Ref Range   Glucose-Capillary 126 (H) 65 - 99 mg/dL  Glucose, capillary     Status: Abnormal   Collection Time: 07/16/16  4:33 PM  Result Value Ref Range   Glucose-Capillary 120 (H) 65 - 99 mg/dL   Comment 1 Notify RN     ABGS No results for input(s): PHART, PO2ART, TCO2, HCO3 in the last 72 hours.  Invalid input(s): PCO2 CULTURES No results found for this or any previous visit (from the past 240 hour(s)). Studies/Results: No results found.  Medications:  Prior to Admission:  Prescriptions Prior to Admission  Medication Sig Dispense Refill Last Dose  . albuterol (PROVENTIL HFA;VENTOLIN HFA) 108 (90 Base) MCG/ACT inhaler Inhale 1-2 puffs into the lungs every 4 (four) hours as needed for wheezing or shortness of breath.     . clopidogrel (PLAVIX) 75 MG tablet Take 75 mg by mouth daily.   07/13/2016 at Unknown time  . furosemide (LASIX) 20 MG tablet Take 20 mg by mouth 2 (two) times daily.   07/13/2016 at Unknown time  . LORazepam (ATIVAN) 1 MG tablet Take 1 tablet (1 mg total) by mouth at bedtime. (Patient taking differently: Take 0.5-1 mg  by mouth 2 (two) times daily. 1/2 tablet in the morning and 1 tablet at bedtime) 15 tablet 0 07/12/2016 at Unknown time  . predniSONE (DELTASONE) 5 MG tablet Take 5 mg by mouth daily with breakfast.   07/13/2016 at Unknown time  . simvastatin (ZOCOR) 20 MG tablet Take 20 mg by mouth every evening.   07/12/2016 at Unknown time  . traMADol (ULTRAM) 50 MG tablet Take 50 mg by mouth 3 (three) times daily as needed for moderate pain.   07/13/2016 at 800  . TURMERIC CURCUMIN PO Take 500 mg by mouth 1 day or 1 dose.     Marland Kitchen aspirin EC 81 MG tablet Take 81 mg by mouth daily.   08/21/2012 at Unknown  . enoxaparin (LOVENOX) 40 MG/0.4ML injection Inject 0.4 mLs (40 mg total) into the skin daily. For 28 days (Patient not taking: Reported  on 07/13/2016) 0 Syringe  Not Taking at Unknown time  . polyethylene glycol-electrolytes (NULYTELY/GOLYTELY) 420 G solution Take 4,000 mLs by mouth once. (Patient not taking: Reported on 07/13/2016) 4000 mL 0 Not Taking at Unknown time   Scheduled: . albuterol  2.5 mg Nebulization TID  . aspirin EC  81 mg Oral Daily  . clopidogrel  75 mg Oral Daily  . enoxaparin (LOVENOX) injection  40 mg Subcutaneous Q24H  . furosemide  40 mg Intravenous Daily  . LORazepam  0.5 mg Oral Daily   And  . LORazepam  1 mg Oral QHS  . mometasone-formoterol  2 puff Inhalation BID  . simvastatin  20 mg Oral QPM  . sodium chloride flush  3 mL Intravenous Q12H   Continuous:  OVF:[id number]  Assesment: She had shortness of breath and hypoxia which I think is probably multifactorial. She probably has some element of lung disease and some element of heart failure. She looks better. She had pulmonary function testing at another facility but I have not received the results of that yet. Principal Problem:   Hypoxia Active Problems:   Leukocytosis   Hyperglycemia   Hyponatremia   Dyspnea    Plan: Continue current treatments. Okay for discharge from a strictly pulmonary point of view    LOS: 1 day   Brooke Gibbs L 07/17/2016, 8:49 AM

## 2016-07-17 NOTE — Care Management Note (Signed)
Case Management Note  Patient Details  Name: Brooke Gibbs MRN: [id number] Date of Birth: [birth year]  Expected Discharge Date:  07/15/16               Expected Discharge Plan:  Home w Home Health Services  In-House Referral:  NA  Discharge planning Services  CM Consult  Post Acute Care Choice:  Home Health Choice offered to:  Patient  DME Arranged:    DME Agency:     HH Arranged:  RN, Social Work, PT HH Agency:  [facility]  Status of Service:  Completed, signed off  If discussed at [employer], dates discussed:    Additional Comments: Pt discharging home today with [facility] services through [facility]. Pt is aware that [facility] has 48 hrs to initiate services. Alroy Bailiff, of [facility], made aware of DC today and will obtain orders from chart. Pt saturating well on room air at rest, CM asked nursing staff to ambulate pt on room air prior to DC to ensure saturations will maintain with activity.  Malcolm Metro, RN 07/17/2016, 12:46 PM

## 2016-07-17 NOTE — Care Management Important Message (Signed)
Important Message  Patient Details  Name: Brooke Gibbs MRN: [id number] Date of Birth: 14-Apr-1931   Medicare Important Message Given:  Yes    Malcolm Metro, RN 07/17/2016, 12:45 PM

## 2016-07-17 NOTE — Discharge Summary (Signed)
Physician Discharge Summary  Brooke Gibbs ZOX:[id number] DOB: 24-Nov-1931 DOA: 07/13/2016  PCP: Juliette Alcide, MD  Admit date: 07/13/2016 Discharge date: 07/17/2016  Time spent: 35 minutes  Recommendations for Outpatient Follow-up:  1. Follow up with PCP in one week.  2. Follow up with Dr Juanetta Gosling next week.    Discharge Diagnoses:  Principal Problem:   Hypoxia Active Problems:   Leukocytosis   Hyperglycemia   Hyponatremia   Dyspnea   Discharge Condition: Improved.  No DOE and No supplemental oxygen requirement.  Diet recommendation: Carb modified diet.   Filed Weights   07/15/16 0646 07/16/16 0643 07/17/16 0614  Weight: 73.9 kg (162 lb 14.7 oz) 73.7 kg (162 lb 7.7 oz) 73.7 kg (162 lb 6.4 oz)    History of present illness:  Patient was admitted for DOE and hypoxia by Dr Selena Batten on July 13, 2016.  As per his H nad P:  " Brooke Gibbs  is a 82 y.o. female, w hypertension, hyperlipidemia, CHF ? EF, unknown, apparently c/o dyspnea x a couple of weeks.  especially with walking.  Denies fever, chills, cough, cp, palp. Pt notes slight right leg swelling.  Slight orthopnea. ? Weight gain. Pt presented for evaluation of dyspnea.     In ED,  CTA chest negative for PE.  Showed some basilar scarring.  Pt will be admitted for hypoxia pox 88%,     Hospital Course: Brooke Mcenery Peoplesis an 82 y.o.femaleadmitted for DOE, but unclear exact etiology, as her CTA showed no PE, no evidence of COPD, and no CHF. She has low BNP, and negative troponin, with no EKG suggestive of ischemia, and she had no chest pain or wheezing. I spoke with her PCP this am, Dr Leandrew Koyanagi, and obtain her ECHO result. It showed EF of 55%, with no significant valvular disease, and grade II diastolic dysfx. She is improving, but we still don't know exactly why she has DOE. She did have long term exposure to second hand smoke, as her first husband was a smoker.  Dr Juanetta Gosling has seen her today, and will review her PFTs.  It  still has not been made available from [facility] hospital at this time.  He felt that it may have been multiple etiology, including slight CHF, COPD exacerbation.  I think her anxiety played a role as well.  It was decided to give her some IV Lasix, and start her on Dulera. She improved, and no longer requires supplemental oxygen.  She felt markedly better, and is anxious to go home.  She is stable for discharge, and will be discharged home today.  She should follow up with her PCP and will defer cardiology referral to her PCP.  She will follow up with Dr Juanetta Gosling for further evaluation and Tx of her suspected ILD as well.  Thank you for allowing me to participate in her care.  Good Day.    Consultations:  Pulmonary:  Dr Juanetta Gosling.   Discharge Exam: Vitals:   07/16/16 2001 07/17/16 0614  BP: 130/62 125/79  Pulse: 70 100  Resp: 18 16  Temp: 98.7 F (37.1 C) 98.1 F (36.7 C)   Discharge Instructions   Discharge Instructions    Diet - low sodium heart healthy    Complete by:  As directed   Discharge instructions    Complete by:  As directed   Use your meds as prescribed.  See your PCP, and follow up with Dr Juanetta Gosling.   Increase activity slowly    Complete  by:  As directed     Current Discharge Medication List    START taking these medications   Details  mometasone-formoterol (DULERA) 100-5 MCG/ACT AERO Inhale 2 puffs into the lungs 2 (two) times daily. Qty: 1 Inhaler, Refills: 5      CONTINUE these medications which have NOT CHANGED   Details  albuterol (PROVENTIL HFA;VENTOLIN HFA) 108 (90 Base) MCG/ACT inhaler Inhale 1-2 puffs into the lungs every 4 (four) hours as needed for wheezing or shortness of breath.    clopidogrel (PLAVIX) 75 MG tablet Take 75 mg by mouth daily.    furosemide (LASIX) 20 MG tablet Take 20 mg by mouth 2 (two) times daily.    LORazepam (ATIVAN) 1 MG tablet Take 1 tablet (1 mg total) by mouth at bedtime. Qty: 15 tablet, Refills: 0    simvastatin (ZOCOR)  20 MG tablet Take 20 mg by mouth every evening.    traMADol (ULTRAM) 50 MG tablet Take 50 mg by mouth 3 (three) times daily as needed for moderate pain.    aspirin EC 81 MG tablet Take 81 mg by mouth daily.    polyethylene glycol-electrolytes (NULYTELY/GOLYTELY) 420 G solution Take 4,000 mLs by mouth once. Qty: 4000 mL, Refills: 0   Associated Diagnoses: Encounter for screening colonoscopy      STOP taking these medications     predniSONE (DELTASONE) 5 MG tablet      TURMERIC CURCUMIN PO      enoxaparin (LOVENOX) 40 MG/0.4ML injection        Allergies  Allergen Reactions  . Sulfa Drugs Cross Reactors     Rash  . Prednisone Anxiety      The results of significant diagnostics from this hospitalization (including imaging, microbiology, ancillary and laboratory) are listed below for reference.    Significant Diagnostic Studies: Ct Angio Chest Pe W Or Wo Contrast  Result Date: 07/13/2016 CLINICAL DATA:  Worsening shortness of breath for the past 1-2 weeks. EXAM: CT ANGIOGRAPHY CHEST WITH CONTRAST TECHNIQUE: Multidetector CT imaging of the chest was performed using the standard protocol during bolus administration of intravenous contrast. Multiplanar CT image reconstructions and MIPs were obtained to evaluate the vascular anatomy. CONTRAST:  100 cc Isovue 370 IV COMPARISON:  01/23/2010 FINDINGS: Cardiovascular: No filling defects in the pulmonary arteries to suggest pulmonary emboli. Mild cardiomegaly. Scattered aortic calcifications. No aneurysm. No dissection. Mediastinum/Nodes: No mediastinal, hilar, or axillary adenopathy. Lungs/Pleura: Elevation of the right hemidiaphragm. Right lower lobe atelectasis or infiltrate. Nodular densities at the left base are unchanged since 2011 compatible with scarring. No pleural effusions. Upper Abdomen: Small gallstones within the gallbladder. No acute findings in the upper abdomen. Musculoskeletal: No acute bony abnormality or focal bone lesion.  Review of the MIP images confirms the above findings. IMPRESSION: No evidence of pulmonary embolus. Cardiomegaly. Right base atelectasis.  Left basilar scarring. No acute findings. Cholelithiasis. Electronically Signed   By: Charlett Nose M.D.   On: 07/13/2016 15:36   Dg Chest Portable 1 View  Result Date: 07/13/2016 CLINICAL DATA:  Short of breath for several weeks, worsening over the last 5 days. EXAM: PORTABLE CHEST 1 VIEW COMPARISON:  05/22/2016 FINDINGS: The heart is mildly enlarged. Lungs are very under aerated. There is marked elevation of the right hemidiaphragm. There is bibasilar atelectasis. No pneumothorax. IMPRESSION: Cardiomegaly without edema. Bibasilar atelectasis Marked elevation of the right hemidiaphragm. Electronically Signed   By: Jolaine Click M.D.   On: 07/13/2016 12:24  Labs: Basic Metabolic Panel:  Recent Labs Lab  07/13/16 1142 07/14/16 0414 07/15/16 0448  NA 129* 136 138  K 3.7 3.2* 4.2  CL 94* 94* 99*  CO2 27 33* 31  GLUCOSE 118* 109* 113*  BUN 15 13 14   CREATININE 0.78 0.72 0.66  CALCIUM 8.8* 9.0 9.2   Liver Function Tests:  Recent Labs Lab 07/14/16 0414  AST 19  ALT 17  ALKPHOS 134*  BILITOT 0.8  PROT 7.1  ALBUMIN 3.3*    Recent Labs Lab 07/13/16 1142 07/14/16 0414  WBC 17.7* 8.4  NEUTROABS 14.8* 5.1  HGB 14.0 12.3  HCT 40.9 38.0  MCV 87.4 89.0  PLT 504* 488*   Cardiac Enzymes:  Recent Labs Lab 07/13/16 1142  TROPONINI <0.03   BNP: BNP (last 3 results)  Recent Labs  07/13/16 1146  BNP 99.0   CBG:  Recent Labs Lab 07/15/16 1654 07/15/16 2059 07/16/16 0802 07/16/16 1125 07/16/16 1633  GLUCAP 119* 127* 125* 126* 120*    Signed:  Cid Agena MD. Jerrel Ivory.  [facility] 07/17/2016, 12:13 PM

## 2016-07-18 NOTE — Progress Notes (Signed)
Discharge instructions and prescriptions given, verbalized understanding, out in stable condition via w/c with staff. 

## 2016-08-24 ENCOUNTER — Emergency Department (HOSPITAL_COMMUNITY)
Admission: EM | Admit: 2016-08-24 | Discharge: 2016-08-24 | Disposition: A | Discharge status: Home / Self Care | Payer: Medicare Other | Attending: Emergency Medicine | Admitting: Emergency Medicine

## 2016-08-24 ENCOUNTER — Emergency Department (HOSPITAL_COMMUNITY): Payer: Medicare Other

## 2016-08-24 ENCOUNTER — Encounter (HOSPITAL_COMMUNITY): Payer: Self-pay | Admitting: Emergency Medicine

## 2016-08-24 DIAGNOSIS — Y92096 Garden or yard of other non-institutional residence as the place of occurrence of the external cause: Secondary | ICD-10-CM | POA: Diagnosis not present

## 2016-08-24 DIAGNOSIS — Y999 Unspecified external cause status: Secondary | ICD-10-CM | POA: Diagnosis not present

## 2016-08-24 DIAGNOSIS — M542 Cervicalgia: Secondary | ICD-10-CM | POA: Insufficient documentation

## 2016-08-24 DIAGNOSIS — I10 Essential (primary) hypertension: Secondary | ICD-10-CM | POA: Diagnosis not present

## 2016-08-24 DIAGNOSIS — Y9301 Activity, walking, marching and hiking: Secondary | ICD-10-CM | POA: Diagnosis not present

## 2016-08-24 DIAGNOSIS — W01198A Fall on same level from slipping, tripping and stumbling with subsequent striking against other object, initial encounter: Secondary | ICD-10-CM | POA: Diagnosis not present

## 2016-08-24 DIAGNOSIS — Z7982 Long term (current) use of aspirin: Secondary | ICD-10-CM | POA: Insufficient documentation

## 2016-08-24 DIAGNOSIS — W19XXXA Unspecified fall, initial encounter: Secondary | ICD-10-CM

## 2016-08-24 DIAGNOSIS — Z79899 Other long term (current) drug therapy: Secondary | ICD-10-CM | POA: Diagnosis not present

## 2016-08-24 MED ORDER — OXYCODONE-ACETAMINOPHEN 5-325 MG PO TABS
1.0000 | ORAL_TABLET | Freq: Once | ORAL | Status: AC
  Administered 2016-08-24: 1 via ORAL
  Filled 2016-08-24: qty 1

## 2016-08-24 NOTE — ED Triage Notes (Signed)
Pt fell yesterday in yard and hit head in grass.  Pt having neck pain and bilateral knee pain today. Pt was supposed to be walking with walker, but wasn't.  Denies loc, pt alert and oriented at this time.

## 2016-08-24 NOTE — ED Notes (Signed)
Pt states she was walking in yard without walker and fell in the grass

## 2016-08-24 NOTE — ED Notes (Signed)
Patient given discharge instruction, verbalized understand. Patient wheelchair out of the department.  

## 2016-08-24 NOTE — ED Provider Notes (Signed)
[facility]-EMERG[facility]Y DEPT Provider Note   CSN: [id number] Arrival date & time: 08/24/16  1302  By signing my name below, I, Brooke Gibbs, attest that this documentation has been prepared under the direction and in the presence of Brooke Razor, MD . Electronically Signed: Majel Gibbs, Scribe. 08/24/2016. 1:24 PM.  History   Chief Complaint Chief Complaint  Patient presents with  . Fall   The history is provided by the patient. No language interpreter was used.   HPI Comments: Brooke Gibbs is a 82 y.o. female who presents to the Emergency Department complaining of gradually worsening, neck pain s/p a fall that occurred this morning. Pt reports she was wandering in her yard yesterday afternoon when she slipped on a rock and and stuck her head on the grass. Per daughter, pt normally walks with a walker but she was not using it at the time of her fall. Pt's daughter notes pt was given tramadol this morning for her pain with moderate relief. She denies loss of consciousness, numbness or tingling in her extremities and confusion.    Past Medical History:  Diagnosis Date  . Acute pulmonary edema (HCC)   . Arthritis   . Hyperlipidemia   . Hypertension   . Impaired fasting glucose   . Macular degeneration   . Renal disorder   . Stroke (cerebrum) [facility])     Patient Active Problem List   Diagnosis Date Noted  . Hypoxia 07/13/2016  . Hyponatremia 07/13/2016  . Dyspnea 07/13/2016  . Constipation 08/25/2012  . Fracture of femoral neck, right (HCC) 08/22/2012  . Leukocytosis 08/22/2012  . Elevated blood pressure 08/22/2012  . Obesity (BMI 30.0-34.9) 08/22/2012  . Hyperglycemia 08/22/2012  . HYPERLIPIDEMIA-MIXED 08/26/2009  . CHEST PAIN-UNSPECIFIED 08/26/2009    Past Surgical History:  Procedure Laterality Date  . CATARACT EXTRACTION    . ORIF HIP FRACTURE  08/23/2012   Procedure: OPEN REDUCTION INTERNAL FIXATION HIP;  Surgeon: Darreld Mclean, MD;  Location: [facility];  Service: Orthopedics;   Laterality: Right;  . TONSILLECTOMY    . TUBAL LIGATION      OB History    No data available       Home Medications    Prior to Admission medications   Medication Sig Start Date End Date Taking? Authorizing Provider  albuterol (PROVENTIL HFA;VENTOLIN HFA) 108 (90 Base) MCG/ACT inhaler Inhale 1-2 puffs into the lungs every 4 (four) hours as needed for wheezing or shortness of breath.    Historical Provider, MD  aspirin EC 81 MG tablet Take 81 mg by mouth daily.    Historical Provider, MD  clopidogrel (PLAVIX) 75 MG tablet Take 75 mg by mouth daily.    Historical Provider, MD  furosemide (LASIX) 20 MG tablet Take 20 mg by mouth 2 (two) times daily.    Historical Provider, MD  LORazepam (ATIVAN) 1 MG tablet Take 1 tablet (1 mg total) by mouth at bedtime. Patient taking differently: Take 0.5-1 mg by mouth 2 (two) times daily. 1/2 tablet in the morning and 1 tablet at bedtime 08/26/12   Christiane Ha, MD  mometasone-formoterol (DULERA) 100-5 MCG/ACT AERO Inhale 2 puffs into the lungs 2 (two) times daily. 07/17/16   Houston Siren, MD  polyethylene glycol-electrolytes (NULYTELY/GOLYTELY) 420 G solution Take 4,000 mLs by mouth once. Patient not taking: Reported on 07/13/2016 11/20/15   Len Blalock, NP  simvastatin (ZOCOR) 20 MG tablet Take 20 mg by mouth every evening.    Historical Provider, MD  traMADol [name])  50 MG tablet Take 50 mg by mouth 3 (three) times daily as needed for moderate pain.    Historical Provider, MD    Family History Family History  Problem Relation Age of Onset  . Heart failure Mother   . Cancer Sister   . Heart failure Sister   . Stroke Brother   . Kidney disease Daughter     Social History Social History  Substance Use Topics  . Smoking status: Never Smoker  . Smokeless tobacco: Never Used  . Alcohol use No     Allergies   Sulfa drugs cross reactors and Prednisone   Review of Systems Review of Systems  Musculoskeletal: Positive for arthralgias and  neck pain.  Neurological: Negative for syncope.   Physical Exam Updated Vital Signs BP 149/81 (BP Location: Left Arm)   Pulse 64   Temp 98.4 F (36.9 C) (Oral)   Resp 16   Ht 5\' 2"  (1.575 m)   Wt 155 lb (70.3 kg)   SpO2 97%   BMI 28.35 kg/m   Physical Exam  Constitutional: She is oriented to person, place, and time. She appears well-developed and well-nourished. No distress.  HENT:  Head: Normocephalic and atraumatic.  Eyes: EOM are normal.  Neck: Normal range of motion.  Mild midline and cervical neck tenderness   Cardiovascular: Normal rate, regular rhythm and normal heart sounds.   Pulmonary/Chest: Effort normal and breath sounds normal.  Abdominal: Soft. She exhibits no distension. There is no tenderness.  Musculoskeletal: Normal range of motion.  Neurological: She is alert and oriented to person, place, and time.  Skin: Skin is warm and dry.  Psychiatric: She has a normal mood and affect. Judgment normal.  Nursing note and vitals reviewed.  ED Treatments / Results  Labs (all labs ordered are listed, but only abnormal results are displayed) Labs Reviewed - No data to display  EKG  EKG Interpretation None       Radiology No results found.   Ct Cervical Spine Wo Contrast  Result Date: 08/24/2016 CLINICAL DATA:  Fall with neck pain.  Initial encounter. EXAM: CT CERVICAL SPINE WITHOUT CONTRAST TECHNIQUE: Multidetector CT imaging of the cervical spine was performed without intravenous contrast. Multiplanar CT image reconstructions were also generated. COMPARISON:  CTA of the neck 04/14/2016 FINDINGS: Sensitivity diminished by motion.  Overall diagnostic exam. Alignment: Exaggerated lordosis with C4-5 anterolisthesis and chronic C7-T1 anterolisthesis. Partly seen upper thoracic levoscoliosis. Skull base and vertebrae: No acute fracture. No primary bone lesion or focal pathologic process. Soft tissues and spinal canal: No prevertebral fluid or swelling. No visible canal  hematoma. Disc levels: Diffuse degenerative disc narrowing. Facet arthropathy focally advanced at C7-T1. Upper chest: No acute or posttraumatic finding. IMPRESSION: Motion degraded study without evidence of acute injury. Electronically Signed   By: Marnee SpringJonathon  Watts M.D.   On: 08/24/2016 15:45   Procedures Procedures (including critical care time)  Medications Ordered in ED Medications - No data to display   Initial Impression / Assessment and Plan / ED Course  I have reviewed the triage vital signs and the nursing notes.  Pertinent labs & imaging results that were available during my care of the patient were reviewed by me and considered in my medical decision making (see chart for details).  Clinical Course  DIAGNOSTIC STUDIES:  Oxygen Saturation is 97% on RA, normal by my interpretation.    COORDINATION OF CARE:  1:23 PM Discussed treatment plan with pt at bedside and pt agreed to plan.  I personally performed the services described in this documentation, which was scribed in my presence. The recorded information has been reviewed and is accurate.   Final Clinical Impressions(s) / ED Diagnoses   Final diagnoses:  Fall, initial encounter  Neck pain    New Prescriptions New Prescriptions   No medications on file     Brooke Razor, MD 08/27/16 1406

## 2016-10-08 ENCOUNTER — Ambulatory Visit (INDEPENDENT_AMBULATORY_CARE_PROVIDER_SITE_OTHER): Payer: Medicare Other | Admitting: Ophthalmology

## 2017-04-04 IMAGING — CT CT CERVICAL SPINE W/O CM
4 series · 15 of 33 positions shown, 18 images · non-contrast
Comparison: CTA of the neck 04/14/2016

CLINICAL DATA: Fall with neck pain.  Initial encounter.

EXAM:
CT CERVICAL SPINE WITHOUT CONTRAST
TECHNIQUE: Multidetector CT imaging of the cervical spine was performed without
intravenous contrast. Multiplanar CT image reconstructions were also
generated.

[Series 3: c spine soft · axial · 0.37mm/px · z∈[+110,+134]mm · 2 of 72 slices shown]
[im 12/72  soft-tissue]
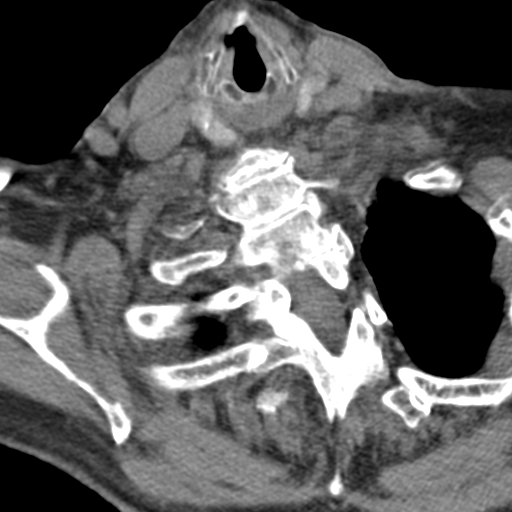
[im 24/72  soft-tissue]
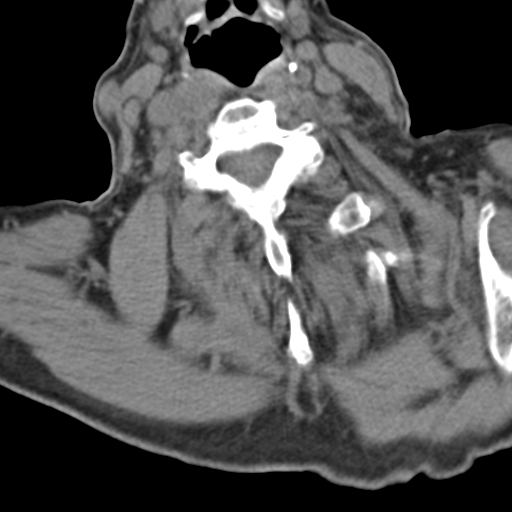

[Series 4: sagittal bone · sagittal · 0.31mm/px · 5 of 74 slices shown, 6 images]
[im 25/74  bone]
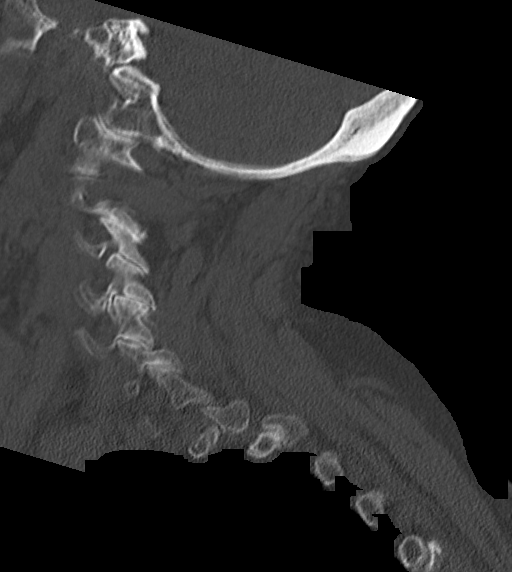
[im 31/74  bone]
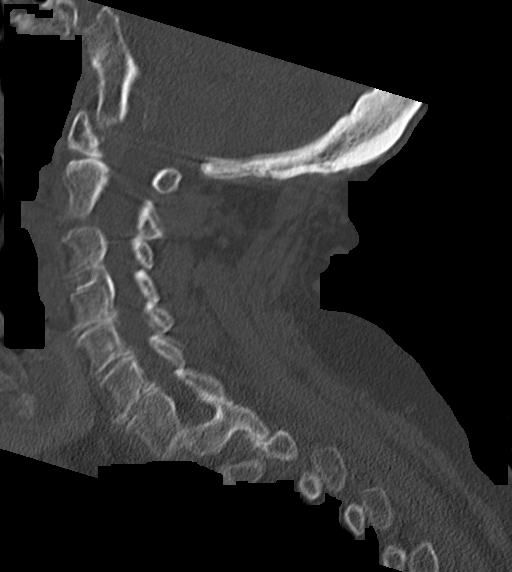
[im 37/74  soft-tissue]
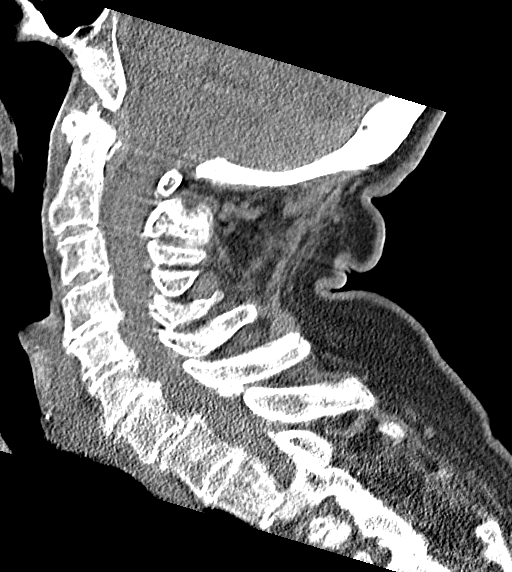
[im 37/74  bone]
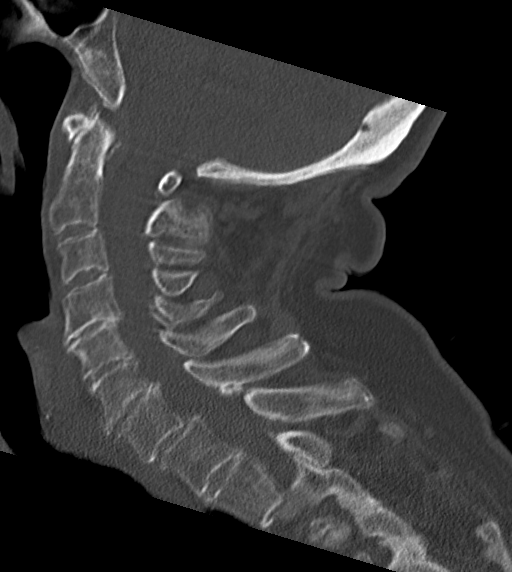
[im 43/74  bone]
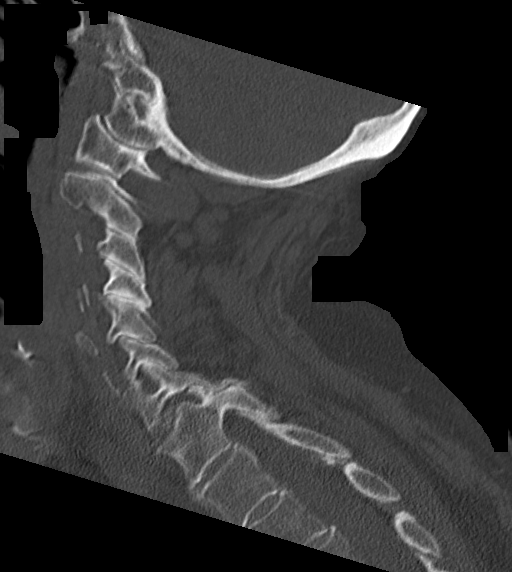
[im 49/74  bone]
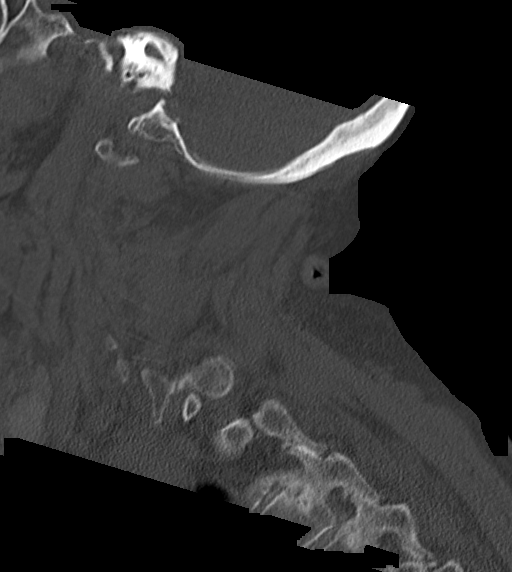

[Series 6: coronal bone · coronal · 0.24mm/px · 3 of 70 slices shown]
[im 16/70  bone]
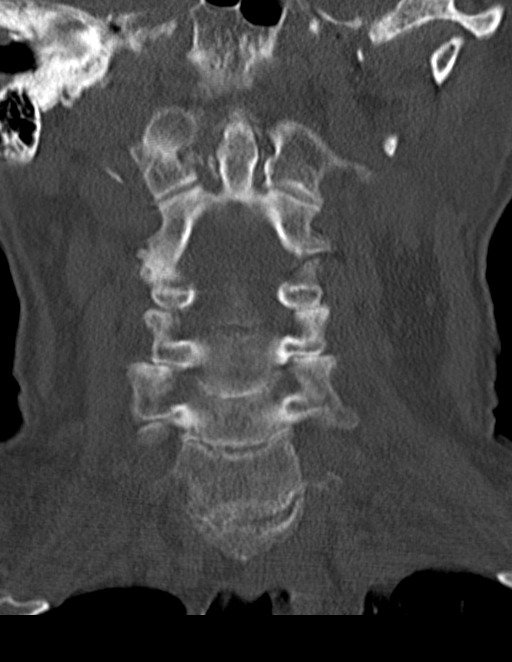
[im 29/70  bone]
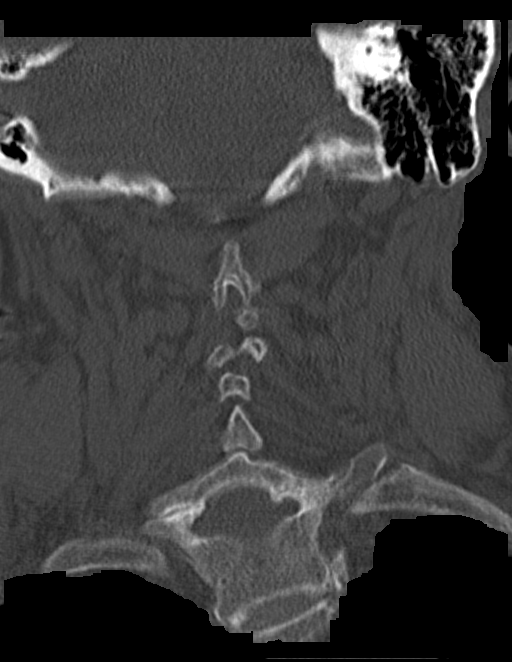
[im 42/70  bone]
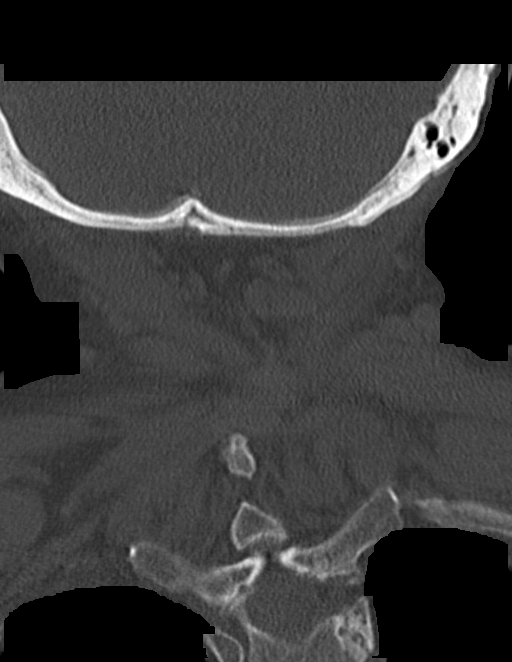

[Series 8: orthogonal axial · axial · 0.21mm/px · z∈[+97,+194]mm · 5 of 78 slices shown, 7 images]
[im 13/78  soft-tissue]
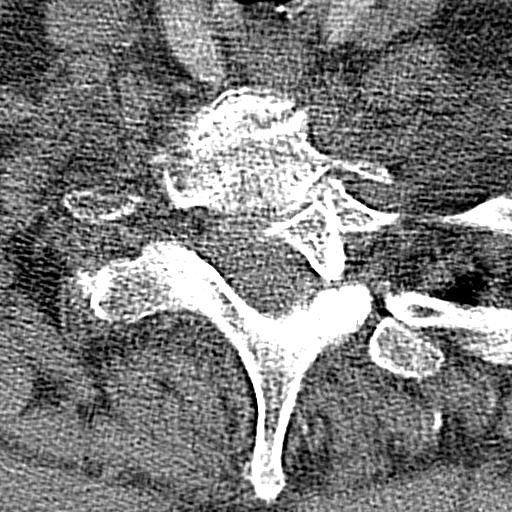
[im 13/78  bone]
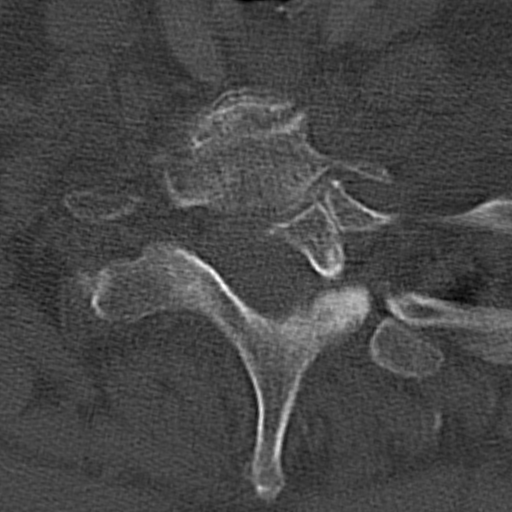
[im 26/78  bone]
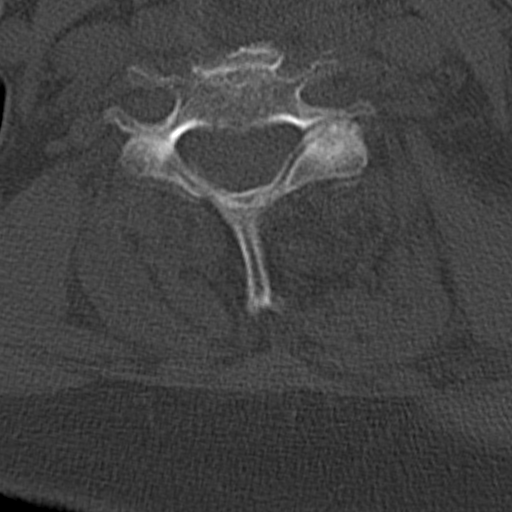
[im 39/78  bone]
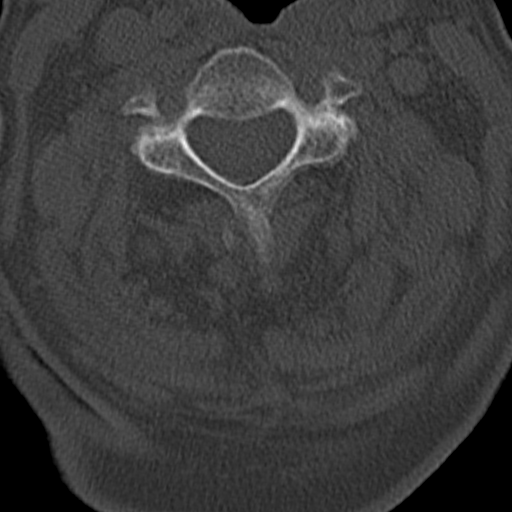
[im 52/78  bone]
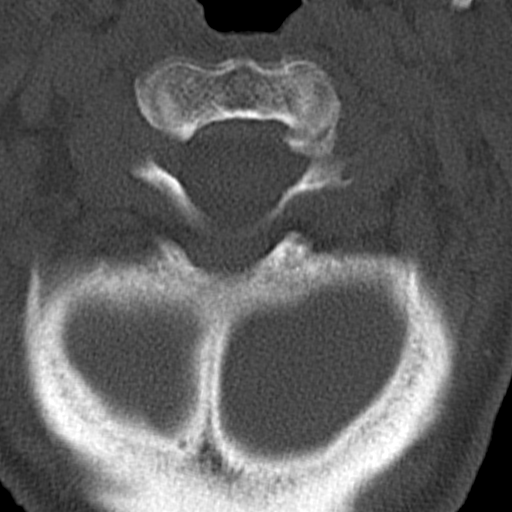
[im 65/78  soft-tissue]
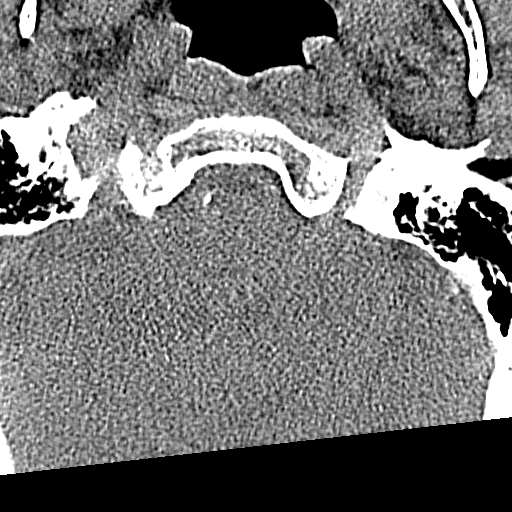
[im 65/78  bone]
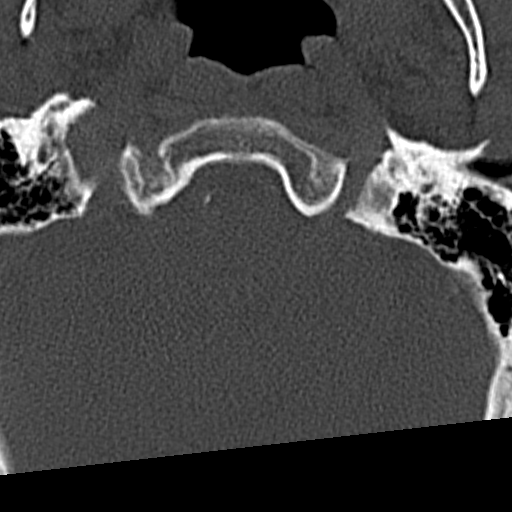

[15 of 33 positions shown; findings below may reference images not displayed]

FINDINGS: Sensitivity diminished by motion.  Overall diagnostic exam.

Alignment: Exaggerated lordosis with C4-5 anterolisthesis and
chronic C7-T1 anterolisthesis. Partly seen upper thoracic
levoscoliosis.

Skull base and vertebrae: No acute fracture. No primary bone lesion
or focal pathologic process.

Soft tissues and spinal canal: No prevertebral fluid or swelling. No
visible canal hematoma.

Disc levels: Diffuse degenerative disc narrowing. Facet arthropathy
focally advanced at C7-T1.

Upper chest: No acute or posttraumatic finding.
IMPRESSION: Motion degraded study without evidence of acute injury.

## 2022-07-14 DEATH — deceased
